# Patient Record
Sex: Female | Born: 1965
Health system: Southern US, Community
[De-identification: ages and names within clinical notes are randomized; demographics above are authoritative.]

## PROBLEM LIST (undated history)

## (undated) DIAGNOSIS — F32A Depression, unspecified: Secondary | ICD-10-CM

## (undated) DIAGNOSIS — R011 Cardiac murmur, unspecified: Secondary | ICD-10-CM

## (undated) DIAGNOSIS — F1910 Other psychoactive substance abuse, uncomplicated: Secondary | ICD-10-CM

## (undated) DIAGNOSIS — I890 Lymphedema, not elsewhere classified: Secondary | ICD-10-CM

## (undated) DIAGNOSIS — E063 Autoimmune thyroiditis: Secondary | ICD-10-CM

## (undated) DIAGNOSIS — F419 Anxiety disorder, unspecified: Secondary | ICD-10-CM

## (undated) DIAGNOSIS — E038 Other specified hypothyroidism: Secondary | ICD-10-CM

## (undated) DIAGNOSIS — R609 Edema, unspecified: Secondary | ICD-10-CM

## (undated) DIAGNOSIS — F329 Major depressive disorder, single episode, unspecified: Secondary | ICD-10-CM

## (undated) HISTORY — PX: TONSILLECTOMY: SUR1361

## (undated) HISTORY — DX: Autoimmune thyroiditis: E06.3

## (undated) HISTORY — DX: Other psychoactive substance abuse, uncomplicated: F19.10

## (undated) HISTORY — DX: Anxiety disorder, unspecified: F41.9

## (undated) HISTORY — DX: Depression, unspecified: F32.A

## (undated) HISTORY — PX: OTHER SURGICAL HISTORY: SHX169

## (undated) HISTORY — DX: Edema, unspecified: R60.9

## (undated) HISTORY — DX: Major depressive disorder, single episode, unspecified: F32.9

## (undated) HISTORY — DX: Lymphedema, not elsewhere classified: I89.0

## (undated) HISTORY — PX: MEDIAL PARTIAL KNEE REPLACEMENT: SHX5965

## (undated) HISTORY — DX: Other specified hypothyroidism: E03.8

## (undated) HISTORY — PX: TUBAL LIGATION: SHX77

## (undated) HISTORY — DX: Cardiac murmur, unspecified: R01.1

---

## 1997-06-27 ENCOUNTER — Encounter: Admission: RE | Admit: 1997-06-27 | Discharge: 1997-09-25 | Payer: Self-pay | Admitting: *Deleted

## 1997-07-01 ENCOUNTER — Emergency Department (HOSPITAL_COMMUNITY): Admission: RE | Admit: 1997-07-01 | Discharge: 1997-07-01 | Payer: Self-pay | Admitting: Emergency Medicine

## 1998-05-07 ENCOUNTER — Emergency Department (HOSPITAL_COMMUNITY): Admission: EM | Admit: 1998-05-07 | Discharge: 1998-05-07 | Payer: Self-pay | Admitting: Emergency Medicine

## 1998-05-13 ENCOUNTER — Emergency Department (HOSPITAL_COMMUNITY): Admission: EM | Admit: 1998-05-13 | Discharge: 1998-05-13 | Payer: Self-pay | Admitting: Emergency Medicine

## 1998-09-03 ENCOUNTER — Emergency Department (HOSPITAL_COMMUNITY): Admission: EM | Admit: 1998-09-03 | Discharge: 1998-09-03 | Payer: Self-pay | Admitting: Internal Medicine

## 1998-09-15 ENCOUNTER — Emergency Department (HOSPITAL_COMMUNITY): Admission: EM | Admit: 1998-09-15 | Discharge: 1998-09-15 | Payer: Self-pay | Admitting: Emergency Medicine

## 1998-09-15 ENCOUNTER — Encounter: Payer: Self-pay | Admitting: Emergency Medicine

## 1998-12-09 ENCOUNTER — Inpatient Hospital Stay (HOSPITAL_COMMUNITY): Admission: EM | Admit: 1998-12-09 | Discharge: 1998-12-12 | Payer: Self-pay | Admitting: Emergency Medicine

## 1998-12-12 ENCOUNTER — Other Ambulatory Visit (HOSPITAL_COMMUNITY): Admission: RE | Admit: 1998-12-12 | Discharge: 1999-01-18 | Payer: Self-pay | Admitting: Psychiatry

## 1999-03-17 ENCOUNTER — Emergency Department (HOSPITAL_COMMUNITY): Admission: EM | Admit: 1999-03-17 | Discharge: 1999-03-17 | Payer: Self-pay | Admitting: Emergency Medicine

## 1999-03-31 ENCOUNTER — Emergency Department (HOSPITAL_COMMUNITY): Admission: EM | Admit: 1999-03-31 | Discharge: 1999-03-31 | Payer: Self-pay | Admitting: *Deleted

## 1999-06-16 ENCOUNTER — Emergency Department (HOSPITAL_COMMUNITY): Admission: EM | Admit: 1999-06-16 | Discharge: 1999-06-16 | Payer: Self-pay

## 1999-07-01 ENCOUNTER — Inpatient Hospital Stay (HOSPITAL_COMMUNITY): Admission: AD | Admit: 1999-07-01 | Discharge: 1999-07-01 | Payer: Self-pay | Admitting: Obstetrics and Gynecology

## 1999-07-01 ENCOUNTER — Encounter: Payer: Self-pay | Admitting: Obstetrics and Gynecology

## 1999-07-16 ENCOUNTER — Other Ambulatory Visit: Admission: RE | Admit: 1999-07-16 | Discharge: 1999-07-16 | Payer: Self-pay | Admitting: Obstetrics and Gynecology

## 1999-08-04 ENCOUNTER — Inpatient Hospital Stay (HOSPITAL_COMMUNITY): Admission: AD | Admit: 1999-08-04 | Discharge: 1999-08-04 | Payer: Self-pay | Admitting: Obstetrics and Gynecology

## 1999-08-16 ENCOUNTER — Inpatient Hospital Stay (HOSPITAL_COMMUNITY): Admission: AD | Admit: 1999-08-16 | Discharge: 1999-08-16 | Payer: Self-pay | Admitting: *Deleted

## 1999-08-16 ENCOUNTER — Encounter: Payer: Self-pay | Admitting: *Deleted

## 1999-08-18 ENCOUNTER — Emergency Department (HOSPITAL_COMMUNITY): Admission: EM | Admit: 1999-08-18 | Discharge: 1999-08-18 | Payer: Self-pay | Admitting: Emergency Medicine

## 1999-08-25 ENCOUNTER — Emergency Department (HOSPITAL_COMMUNITY): Admission: EM | Admit: 1999-08-25 | Discharge: 1999-08-25 | Payer: Self-pay | Admitting: Emergency Medicine

## 1999-08-31 ENCOUNTER — Emergency Department (HOSPITAL_COMMUNITY): Admission: EM | Admit: 1999-08-31 | Discharge: 1999-08-31 | Payer: Self-pay | Admitting: Emergency Medicine

## 1999-08-31 ENCOUNTER — Encounter: Payer: Self-pay | Admitting: Emergency Medicine

## 1999-09-06 ENCOUNTER — Emergency Department (HOSPITAL_COMMUNITY): Admission: EM | Admit: 1999-09-06 | Discharge: 1999-09-06 | Payer: Self-pay | Admitting: Emergency Medicine

## 1999-09-09 ENCOUNTER — Emergency Department (HOSPITAL_COMMUNITY): Admission: EM | Admit: 1999-09-09 | Discharge: 1999-09-09 | Payer: Self-pay | Admitting: Emergency Medicine

## 1999-09-13 ENCOUNTER — Emergency Department (HOSPITAL_COMMUNITY): Admission: EM | Admit: 1999-09-13 | Discharge: 1999-09-13 | Payer: Self-pay | Admitting: Emergency Medicine

## 1999-09-16 ENCOUNTER — Emergency Department (HOSPITAL_COMMUNITY): Admission: EM | Admit: 1999-09-16 | Discharge: 1999-09-16 | Payer: Self-pay | Admitting: Emergency Medicine

## 1999-09-21 ENCOUNTER — Emergency Department (HOSPITAL_COMMUNITY): Admission: EM | Admit: 1999-09-21 | Discharge: 1999-09-21 | Payer: Self-pay | Admitting: Internal Medicine

## 1999-10-29 ENCOUNTER — Emergency Department (HOSPITAL_COMMUNITY): Admission: EM | Admit: 1999-10-29 | Discharge: 1999-10-29 | Payer: Self-pay | Admitting: Emergency Medicine

## 1999-12-22 ENCOUNTER — Inpatient Hospital Stay (HOSPITAL_COMMUNITY): Admission: AD | Admit: 1999-12-22 | Discharge: 1999-12-22 | Payer: Self-pay | Admitting: Obstetrics and Gynecology

## 2000-02-22 ENCOUNTER — Inpatient Hospital Stay (HOSPITAL_COMMUNITY): Admission: AD | Admit: 2000-02-22 | Discharge: 2000-02-26 | Payer: Self-pay | Admitting: Obstetrics and Gynecology

## 2000-02-28 ENCOUNTER — Inpatient Hospital Stay (HOSPITAL_COMMUNITY): Admission: AD | Admit: 2000-02-28 | Discharge: 2000-02-28 | Payer: Self-pay | Admitting: Obstetrics and Gynecology

## 2000-03-17 ENCOUNTER — Emergency Department (HOSPITAL_COMMUNITY): Admission: EM | Admit: 2000-03-17 | Discharge: 2000-03-17 | Payer: Self-pay | Admitting: Emergency Medicine

## 2000-03-31 ENCOUNTER — Emergency Department (HOSPITAL_COMMUNITY): Admission: EM | Admit: 2000-03-31 | Discharge: 2000-03-31 | Payer: Self-pay | Admitting: Internal Medicine

## 2000-03-31 ENCOUNTER — Encounter: Payer: Self-pay | Admitting: Internal Medicine

## 2000-04-09 ENCOUNTER — Other Ambulatory Visit: Admission: RE | Admit: 2000-04-09 | Discharge: 2000-04-09 | Payer: Self-pay | Admitting: Obstetrics and Gynecology

## 2000-05-28 ENCOUNTER — Emergency Department (HOSPITAL_COMMUNITY): Admission: EM | Admit: 2000-05-28 | Discharge: 2000-05-28 | Payer: Self-pay | Admitting: Emergency Medicine

## 2000-06-05 ENCOUNTER — Emergency Department (HOSPITAL_COMMUNITY): Admission: EM | Admit: 2000-06-05 | Discharge: 2000-06-05 | Payer: Self-pay | Admitting: Emergency Medicine

## 2000-06-18 ENCOUNTER — Emergency Department (HOSPITAL_COMMUNITY): Admission: EM | Admit: 2000-06-18 | Discharge: 2000-06-18 | Payer: Self-pay | Admitting: Emergency Medicine

## 2000-07-26 ENCOUNTER — Emergency Department (HOSPITAL_COMMUNITY): Admission: EM | Admit: 2000-07-26 | Discharge: 2000-07-26 | Payer: Self-pay | Admitting: Emergency Medicine

## 2000-08-02 ENCOUNTER — Emergency Department (HOSPITAL_COMMUNITY): Admission: EM | Admit: 2000-08-02 | Discharge: 2000-08-02 | Payer: Self-pay | Admitting: Emergency Medicine

## 2000-09-10 ENCOUNTER — Emergency Department (HOSPITAL_COMMUNITY): Admission: EM | Admit: 2000-09-10 | Discharge: 2000-09-10 | Payer: Self-pay | Admitting: Internal Medicine

## 2000-09-17 ENCOUNTER — Emergency Department (HOSPITAL_COMMUNITY): Admission: EM | Admit: 2000-09-17 | Discharge: 2000-09-17 | Payer: Self-pay | Admitting: Emergency Medicine

## 2000-09-27 ENCOUNTER — Inpatient Hospital Stay (HOSPITAL_COMMUNITY): Admission: AD | Admit: 2000-09-27 | Discharge: 2000-09-27 | Payer: Self-pay | Admitting: *Deleted

## 2000-10-02 ENCOUNTER — Emergency Department (HOSPITAL_COMMUNITY): Admission: EM | Admit: 2000-10-02 | Discharge: 2000-10-02 | Payer: Self-pay | Admitting: Internal Medicine

## 2000-10-22 ENCOUNTER — Inpatient Hospital Stay (HOSPITAL_COMMUNITY): Admission: EM | Admit: 2000-10-22 | Discharge: 2000-10-27 | Payer: Self-pay | Admitting: *Deleted

## 2000-10-28 ENCOUNTER — Other Ambulatory Visit (HOSPITAL_COMMUNITY): Admission: RE | Admit: 2000-10-28 | Discharge: 2000-11-07 | Payer: Self-pay | Admitting: Psychiatry

## 2001-02-05 ENCOUNTER — Emergency Department (HOSPITAL_COMMUNITY): Admission: EM | Admit: 2001-02-05 | Discharge: 2001-02-05 | Payer: Self-pay | Admitting: Emergency Medicine

## 2001-02-17 ENCOUNTER — Emergency Department (HOSPITAL_COMMUNITY): Admission: EM | Admit: 2001-02-17 | Discharge: 2001-02-17 | Payer: Self-pay | Admitting: Emergency Medicine

## 2001-03-05 ENCOUNTER — Emergency Department (HOSPITAL_COMMUNITY): Admission: EM | Admit: 2001-03-05 | Discharge: 2001-03-05 | Payer: Self-pay

## 2001-03-25 ENCOUNTER — Emergency Department (HOSPITAL_COMMUNITY): Admission: EM | Admit: 2001-03-25 | Discharge: 2001-03-25 | Payer: Self-pay | Admitting: Emergency Medicine

## 2001-06-03 ENCOUNTER — Emergency Department (HOSPITAL_COMMUNITY): Admission: EM | Admit: 2001-06-03 | Discharge: 2001-06-03 | Payer: Self-pay | Admitting: Emergency Medicine

## 2001-07-19 ENCOUNTER — Emergency Department (HOSPITAL_COMMUNITY): Admission: EM | Admit: 2001-07-19 | Discharge: 2001-07-19 | Payer: Self-pay | Admitting: *Deleted

## 2001-12-07 ENCOUNTER — Other Ambulatory Visit: Admission: RE | Admit: 2001-12-07 | Discharge: 2001-12-07 | Payer: Self-pay | Admitting: *Deleted

## 2002-03-08 ENCOUNTER — Other Ambulatory Visit (HOSPITAL_COMMUNITY): Admission: RE | Admit: 2002-03-08 | Discharge: 2002-03-26 | Payer: Self-pay | Admitting: Psychiatry

## 2002-12-24 ENCOUNTER — Other Ambulatory Visit: Admission: RE | Admit: 2002-12-24 | Discharge: 2002-12-24 | Payer: Self-pay | Admitting: *Deleted

## 2003-03-13 ENCOUNTER — Inpatient Hospital Stay (HOSPITAL_COMMUNITY): Admission: EM | Admit: 2003-03-13 | Discharge: 2003-03-15 | Payer: Self-pay | Admitting: Emergency Medicine

## 2003-03-13 ENCOUNTER — Encounter: Payer: Self-pay | Admitting: Emergency Medicine

## 2003-03-23 ENCOUNTER — Encounter: Admission: RE | Admit: 2003-03-23 | Discharge: 2003-03-23 | Payer: Self-pay | Admitting: Family Medicine

## 2004-06-06 ENCOUNTER — Ambulatory Visit (HOSPITAL_BASED_OUTPATIENT_CLINIC_OR_DEPARTMENT_OTHER): Admission: RE | Admit: 2004-06-06 | Discharge: 2004-06-06 | Payer: Self-pay | Admitting: Obstetrics and Gynecology

## 2004-06-06 ENCOUNTER — Encounter (INDEPENDENT_AMBULATORY_CARE_PROVIDER_SITE_OTHER): Payer: Self-pay | Admitting: Specialist

## 2004-06-06 ENCOUNTER — Ambulatory Visit (HOSPITAL_COMMUNITY): Admission: RE | Admit: 2004-06-06 | Discharge: 2004-06-06 | Payer: Self-pay | Admitting: Obstetrics and Gynecology

## 2004-11-23 ENCOUNTER — Ambulatory Visit (HOSPITAL_COMMUNITY): Admission: RE | Admit: 2004-11-23 | Discharge: 2004-11-23 | Payer: Self-pay | Admitting: Obstetrics and Gynecology

## 2006-02-14 ENCOUNTER — Ambulatory Visit (HOSPITAL_COMMUNITY): Admission: RE | Admit: 2006-02-14 | Discharge: 2006-02-14 | Payer: Self-pay | Admitting: Obstetrics and Gynecology

## 2006-02-24 ENCOUNTER — Encounter: Admission: RE | Admit: 2006-02-24 | Discharge: 2006-02-24 | Payer: Self-pay | Admitting: Obstetrics and Gynecology

## 2007-02-14 ENCOUNTER — Emergency Department (HOSPITAL_COMMUNITY): Admission: RE | Admit: 2007-02-14 | Discharge: 2007-02-14 | Payer: Self-pay | Admitting: Family Medicine

## 2007-02-23 ENCOUNTER — Ambulatory Visit (HOSPITAL_COMMUNITY): Admission: RE | Admit: 2007-02-23 | Discharge: 2007-02-25 | Payer: Self-pay | Admitting: Neurological Surgery

## 2007-11-04 ENCOUNTER — Ambulatory Visit: Payer: Self-pay | Admitting: Licensed Clinical Social Worker

## 2007-11-12 ENCOUNTER — Ambulatory Visit: Payer: Self-pay | Admitting: Licensed Clinical Social Worker

## 2007-11-24 ENCOUNTER — Ambulatory Visit: Payer: Self-pay | Admitting: Licensed Clinical Social Worker

## 2007-12-02 ENCOUNTER — Ambulatory Visit: Payer: Self-pay | Admitting: Licensed Clinical Social Worker

## 2007-12-09 ENCOUNTER — Ambulatory Visit: Payer: Self-pay | Admitting: Licensed Clinical Social Worker

## 2007-12-23 ENCOUNTER — Ambulatory Visit: Payer: Self-pay | Admitting: Licensed Clinical Social Worker

## 2007-12-29 ENCOUNTER — Ambulatory Visit: Payer: Self-pay | Admitting: Licensed Clinical Social Worker

## 2008-01-13 ENCOUNTER — Ambulatory Visit: Payer: Self-pay | Admitting: Licensed Clinical Social Worker

## 2008-01-21 ENCOUNTER — Ambulatory Visit: Payer: Self-pay | Admitting: Licensed Clinical Social Worker

## 2008-03-17 ENCOUNTER — Ambulatory Visit: Payer: Self-pay | Admitting: Licensed Clinical Social Worker

## 2008-04-04 ENCOUNTER — Ambulatory Visit: Payer: Self-pay | Admitting: Family Medicine

## 2008-04-04 DIAGNOSIS — F329 Major depressive disorder, single episode, unspecified: Secondary | ICD-10-CM | POA: Insufficient documentation

## 2008-04-04 LAB — CONVERTED CEMR LAB
ALT: 19 units/L (ref 0–35)
AST: 20 units/L (ref 0–37)
Chloride: 106 meq/L (ref 96–112)
Cholesterol: 171 mg/dL (ref 0–200)
Eosinophils Absolute: 0 10*3/uL (ref 0.0–0.7)
GFR calc Af Amer: 117 mL/min
HCT: 37.5 % (ref 36.0–46.0)
HDL: 82.6 mg/dL (ref >39.0)
LDL Cholesterol: 73 mg/dL (ref 0–99)
Lymphocytes Relative: 19 % (ref 12.0–46.0)
MCV: 88.1 fL (ref 78.0–100.0)
Monocytes Absolute: 0.2 10*3/uL (ref 0.1–1.0)
Neutro Abs: 4 10*3/uL (ref 1.4–7.7)
Platelets: 370 10*3/uL (ref 150–400)
RDW: 13.2 % (ref 11.5–14.6)
Total CHOL/HDL Ratio: 2.1
VLDL: 15 mg/dL (ref 0–40)

## 2008-04-05 ENCOUNTER — Telehealth: Payer: Self-pay | Admitting: *Deleted

## 2008-04-07 DIAGNOSIS — J1089 Influenza due to other identified influenza virus with other manifestations: Secondary | ICD-10-CM | POA: Insufficient documentation

## 2008-04-08 ENCOUNTER — Telehealth: Payer: Self-pay | Admitting: Internal Medicine

## 2008-04-12 ENCOUNTER — Ambulatory Visit: Payer: Self-pay | Admitting: Family Medicine

## 2008-04-13 ENCOUNTER — Telehealth: Payer: Self-pay | Admitting: *Deleted

## 2008-04-16 ENCOUNTER — Ambulatory Visit: Payer: Self-pay | Admitting: Family Medicine

## 2008-04-19 ENCOUNTER — Telehealth: Payer: Self-pay | Admitting: Family Medicine

## 2008-04-22 ENCOUNTER — Telehealth: Payer: Self-pay | Admitting: Family Medicine

## 2008-04-28 ENCOUNTER — Telehealth: Payer: Self-pay | Admitting: Family Medicine

## 2008-05-03 ENCOUNTER — Encounter: Payer: Self-pay | Admitting: Family Medicine

## 2008-06-21 ENCOUNTER — Encounter: Payer: Self-pay | Admitting: Family Medicine

## 2009-03-07 DIAGNOSIS — F1910 Other psychoactive substance abuse, uncomplicated: Secondary | ICD-10-CM

## 2009-03-07 HISTORY — DX: Other psychoactive substance abuse, uncomplicated: F19.10

## 2009-03-15 ENCOUNTER — Encounter: Admission: RE | Admit: 2009-03-15 | Discharge: 2009-03-15 | Payer: Self-pay | Admitting: Obstetrics and Gynecology

## 2010-06-16 ENCOUNTER — Encounter: Payer: Self-pay | Admitting: Obstetrics and Gynecology

## 2010-06-17 ENCOUNTER — Encounter: Payer: Self-pay | Admitting: Obstetrics and Gynecology

## 2010-06-18 ENCOUNTER — Encounter: Payer: Self-pay | Admitting: Obstetrics and Gynecology

## 2010-10-09 NOTE — H&P (Signed)
Megan Cook, [name] NO.:  [id number]   MEDICAL RECORD NO.:  [id number]          PATIENT TYPE:  INP   LOCATION:  3025                         FACILITY:  [facility]   PHYSICIAN:  Megan Cook, M.D.  DATE OF BIRTH:  [birth year]   DATE OF ADMISSION:  02/23/2007  DATE OF DISCHARGE:                              HISTORY & PHYSICAL   ADMITTING DIAGNOSIS:  Cervical herniated nucleus pulposus, with  spondylosis at C5-6 and C6-7, right cervical radiculopathy.   HISTORY OF PRESENT ILLNESS:  Megan Cook is a 45 year old right-handed  individual who was seen at an urgent care by an orthopedist a couple of  weeks ago.  She complained of severe right shoulder and arm pain, and an  MRI demonstrated presence of a foraminal disc herniation at C6-7 in  addition to spondylitic degeneration and disc herniation of the  subligamentous space at C5-6.  She was initially advised regarding  conservative management.  However, over the past week and half period of  time, she has failed at this and is miserable with pain, now not  receiving relief despite the use of hydrocodone, Valium, and most  recently again being replaced on Decadron.  She initially was hopeful  that she could be treated with decompression and disc arthroplasty.  However, at this time she notes that she cannot bear the pain, while we  have been trying to seek approval from her insurance carrier, [facility], which has flatly denied that option for the patient.  She  is now being admitted to go to the operating room to undergo surgical  decompression and arthrodesis at C5-6 and C6-7.   PAST MEDICAL HISTORY:  1. Her general health has been good.  2. She has hypothyroidism and has been started on Synthroid 0.1 mg      this past summer.   SURGERIES:  1. Tubal ligation in January 2007.  2. Two C-sections in 2001 and 1998.   She notes allergies to CARBOCAINE, which causes swelling at the site of  the  injection.   MEDICATIONS CURRENTLY:  1. The Decadron that she had been using as an outpatient just      recently.  2. Hydrocodone 5-10 mg q.6 h. for pain.   SOCIAL HISTORY:  Reveals that she does not smoke.  She does not drink  alcohol.  She has been in recovery for difficulties with the use of  hydrocodone.  She has had some recent weight gain this past winter  secondary to hypothyroidism.  She has noted a weight gain of 40 pounds.  She is currently 5 feet 7 inches, 195 pounds.   SYSTEMS REVIEW:  Notable for arm weakness, arm pain, neck pain, thyroid  disease and a 14-point review sheet done in the office today.   PHYSICAL EXAMINATION:  NEUROLOGIC:  Reveals that she is an alert,  oriented individual, in moderate distress with neck pain, shoulder and  right arm pain.  She tends to hold her right arm in a flexed fixed  position during the examination, and this is being supported even at the  outer  elbow.  Range of motion in her neck reveals that she can turn 45  degrees left and right.  She finds that she gets some relief of her  symptoms with flexion of the neck forward.  There is no tenderness  overtly in the supraclavicular fossa.  No masses are noted.  Confrontational testing reveals that the deltoids have good strength.  Biceps are 4/5 on the right side.  Wrist extensors are 4/5.  Finger  extensors are 4/5.  Triceps strength is 4/5.  It is definitively weak  compared to the left side.  Deep tendon reflexes are 2+ in the biceps,  absent in the right triceps, 2+ in left triceps, 1+ in the brachial  radialis.  Sensation is diminished on the ulnar aspect of the right hand  from the index finger on out compared to the left-hand.  HEENT:  Otherwise normal.  NECK:  Supple, save for the patient's current positions.  LUNGS:  Clear to auscultation.  HEART:  Regular rate and rhythm.  No murmurs are heard.  ABDOMEN:  Soft, protuberant.  Bowel sounds are positive.  No masses are   palpable.  EXTREMITIES:  Reveal no cyanosis, clubbing, or edema.   IMPRESSION:  The patient has evidence of a herniated nucleus pulposus at  C5-6 and C6-7, with spondylosis primarily at C5-6. She has been advised  regarding the need for surgical decompression and stabilization from C5-  C7.  This is now going to be carried out.      Megan Cook, M.D.  Electronically Signed     HJE/MEDQ  D:  02/24/2007  T:  02/25/2007  Job:  [id number]

## 2010-10-09 NOTE — Op Note (Signed)
NAMEJALEYA, PEBLEY NO.:  [id number]   MEDICAL RECORD NO.:  [id number]          PATIENT TYPE:  INP   LOCATION:  3025                         FACILITY:  [facility]   PHYSICIAN:  Stefani Dama, M.D.  DATE OF BIRTH:  February 28, 1966   DATE OF PROCEDURE:  02/24/2007  DATE OF DISCHARGE:  02/23/2007                               OPERATIVE REPORT   PREOPERATIVE DIAGNOSIS:  Herniated nucleus pulposus and spondylosis C5-  C6 and C6-C7 with right cervical radiculopathy.   POSTOPERATIVE DIAGNOSIS:  Herniated nucleus pulposus and spondylosis C5-  C6 and C6-C7 with right cervical radiculopathy.   PROCEDURE:  Anterior cervical decompression C5-C6 and C6-C7, arthrodesis  with structural allograft, anterior plate fixation C5 to C7.   SURGEON:  Stefani Dama, M.D.   ANESTHESIA:  General endotracheal.   INDICATIONS:  Megan Cook is a 45 year old individual who has had  significant neck, shoulder, and right arm pain.  She has evidence of  spondylitic disease in addition to a herniated nucleus pulposus at C5-C6  and at C6-C7.  It is out the foramen and is giving her severe right  cervical radiculopathy.  She has been advised regarding surgical  decompression of this process.   OPERATIVE PROCEDURE:  The patient was brought to the operating room and  placed on the table in a supine position. After the smooth induction of  general endotracheal anesthesia, she was placed in 5 pounds of halter  traction.  Her neck was shaved, prepped with DuraPrep, and draped in a  sterile fashion.  A transverse incision was created in the left side of  the neck and this was carried down through the platysma.  A plane  between the sternocleidomastoid and the strap muscles was dissected  bluntly until the prevertebral space was reached.  The first  identifiable disc space was noted to be that of C5-C6.  The longus coli  muscle was stripped off of each side of the vertebra and a self-  retaining  retractor was placed in the wound.  The anterior disc space  was then opened with a #15 blade and a combination of Kerrison rongeurs  were used to evacuate a substantial quantity of moderately degenerated  disc material.  As the region of the posterior longitudinal ligament was  reached, there was noted to be some substantial osteophytosis.  The  ligament was taken up then with the 2 mm gold Kerrison punch and  dissection was carried out laterally. There was some small  subligamentous fragments of disc in this region in the lateral recess.  These were removed.  Decompression further allowed good visualization of  the prevertebral space across the left side.  Small osteophytes were  taken off of this side. No disc herniation was noted.  The endplates  were then smoothed and all the cartilaginous material was removed from  the vertebral endplates at C5-C6.  Attention was then turned to C6-C7  where a similar process was undertaken. Here, there was a much larger  free fragment of disc material that had ruptured through and was lying  just ventral to the  nerve root, itself.  This was removed and allowed  for good egress and decompression of the nerve root, itself.  Hemostasis  was achieved in a similar fashion and the endplates were smoothed with a  4 mm barrel bit. Then, two bone grafts, these were allograft transgrafts  measuring 7 mm in height, were prepared by contouring the posterior  aspects of the graft and removing some of the ridges that were cut into  the bone graft themselves.  When the grafts were finally shaped and  sized correctly, they were filled with demineralized bone matrix and  placed into the interspace, first at C6-C7 then at C5-C6.  They were  countersunk slightly. With this, traction was removed.  The soft tissues  were checked for hemostasis and a 34 mm standard size trellis plate was  fitted to the ventral aspect of the vertebral bodies and secured with  six locking  screws measuring 14 mm in size.  These were variable angle  screws. Final localization of the surgical construct was obtained with a  singular radiograph.  The prevertebral tissues were then checked for  hemostasis.  The wound was irrigated copiously with antibiotic  irrigating solution and the platysma was closed with 3-0 Vicryl in an  interrupted fashion.  Then, the  subcuticular tissue was closed with 3-0  Vicryl in an inverted interrupted fashion.  A dry sterile dressing was  placed on the patient's neck.  The patient tolerated procedure well and  was returned to the recovery room in stable condition.      Stefani Dama, M.D.  Electronically Signed     HJE/MEDQ  D:  02/24/2007  T:  02/24/2007  Job:  [id number]

## 2010-10-12 NOTE — Op Note (Signed)
[facility]  Patient:    Megan Cook, Megan Cook                      MRN: [id number] Proc. Date: 02/22/00 Attending:  Lenoard Aden CC:         [facility]   Operative Report  PREOPERATIVE DIAGNOSES:       1. Thirty-nine week intrauterine pregnancy.                               2. Polyhydramnios.                               3. Presumed macrosomia.                               4. Previous cesarean section, for elective                                  repeat.  POSTOPERATIVE DIAGNOSES:      1. Thirty-nine week intrauterine pregnancy.                               2. Polyhydramnios.                               3. Presumed macrosomia.                               4. Previous cesarean section, for elective                                  repeat.  OPERATION:                    Repeat low segment transverse cesarean section.  SURGEON:                      Lenoard Aden, M.D.  ASSISTANT:                    Cordelia Pen A. Rosalio Macadamia, M.D.  ANESTHESIA:                   Spinal by J. Leilani Able, Montez Hageman., M.D.  ESTIMATED BLOOD LOSS:         1000 cc.  COMPLICATIONS:                None.  FINDINGS:                     A full-term living female, 9 pounds and 6 ounces.  Apgars 8 and 9.  Occiput anterior.  Delivered with vacuum assistance x 1 pull.  Placenta manually intact and three vessel cord noted. Extensive bladder to anterior uterine wall adhesions which were lysed sharply. Normal tubes and ovaries.  The patient to recovery in good condition.  All counts correct.  DESCRIPTION OF PROCEDURE:     After being apprised of the risks of anesthesia, infection, bleeding, injury to abdominal organs, and need  for repair, the patient was administered a spinal anesthetic without complications, prepped and draped in the usual sterile fashion.  A Foley catheter was placed and a Pfannenstiel skin incision was made after achieving adequate anesthesia,  and carried down to the fascia which was nicked in midline and opened transversely using Mayo scissors.  The rectus muscle was dissected sharply in the midline. The peritoneum was entered sharply.  Upon entering into the peritoneal cavity, it was noted that the uterus and the lower uterine segment was adhesed to the portion of the fascia.  This was lysed sharply using Mayo scissors and sharp dissection.  The bladder was then developed and dissected sharply off the lower uterine segment and bladder blade placed.  The uterus was scored in a smile-like fashion and vacuum assistance used for delivery of a 9 pound and 6 ounce female from occiput anterior position.  Apgars 8 and 9.  Placenta manually intact and three vessel cord noted.  The uterus was exteriorized with normal tubes and ovaries noted.  The incision was closed using a 0 Monocryl in a continuous running fashion.  Good hemostasis noted.  The uterus was placed in the abdominal cavity and pericolic gutters irrigated and all blood and clot subsequently removed.  Reinspection reveals normally good hemostasis of the incision and normal bladder flap.                                At this time, the fascia was closed using a 0 Vicryl in a continuous running fashion and the skin closed using staples. Dilute Marcaine solution placed.  The patient tolerated the procedure well and went to recovery in good condition. DD:  02/22/00 TD:  02/23/00 Job: [id number]

## 2010-10-12 NOTE — Discharge Summary (Signed)
[facility]  Patient:    Megan Cook, Megan Cook                      MRN: [id number] Adm. Date:  [id number] Disc. Date: [id number] Attending:  Shelba Flake                           Discharge Summary  HOSPITAL COURSE:              The patient underwent an uncomplicated repeat C section on February 22, 2000.  Postpartum course uncomplicated, tolerated a regular diet well, discharged to home on day #3.  DISCHARGE MEDICATIONS:        Prenatal vitamins, iron, and Tylox given. Motrin recommended.  FOLLOWUP:                     In the office in four to six weeks.  DISCHARGE INSTRUCTIONS:       Discharge teaching done. DD:  03/16/00 TD:  03/17/00 Job: [id number]

## 2010-10-12 NOTE — H&P (Signed)
[facility]  Patient:    Megan Cook, Megan Cook                      MRN: [id number] Adm. Date:  [id number] Attending:  Lenoard Aden Dictator:   Lenoard Aden, M.D.                         History and Physical  CHIEF COMPLAINT:              Elective repeat C-section.  HISTORY OF PRESENT ILLNESS:   The patient is a 45 year old white female, G27, P1-0-2-1, EDD February 26, 2000, for elective repeat C-section at 39 weeks. History of polyhydramnios and questionable macrosomia.  PAST MEDICAL HISTORY:         Remarkable for one uncomplicated C-section in 1998, previous history of narcotic abuse many years in the past, history of knee surgery in 1987 and 1988, tonsillectomy in 1991, laser ablation of cervix in 1990.  Pregnancy complicated by prolonged upper respiratory infection requiring multiple doses of antibiotics and preterm cervical change and polyhydramnios.  PRENATAL LABORATORY DATA:     Blood type of O positive, Rh antibody negative, VDRL nonreactive.  Rubella immune.  Hepatitis B surface antigen negative.  HIV nonreactive.  PHYSICAL EXAMINATION:  GENERAL:                      She is a well-developed, well-nourished white female in no apparent distress.  HEENT:                        Normal.  LUNGS:                        Clear.  HEART:                        Regular rate and rhythm.  ABDOMEN:                      Soft, gravid, nontender.  Estimated fetal weight 8 pounds.  PELVIC:                       The cervix is 2 cm, thick, vertex and -2 to -3.  EXTREMITIES:                  Reveals no cords.  NEUROLOGIC EXAM:              Nonfocal.  IMPRESSION:                   A 39-week intrauterine pregnancy for elective                               repeat cesarean section.  PLAN:                         Proceed with elective repeat LTCS.  The risks of anesthesia, infection, bleeding, injury to abdominal organs and the need for repair was  discussed.  The patient acknowledges and desires to proceed. DD:  02/22/00 TD:  02/22/00 Job: [id number]

## 2010-10-12 NOTE — Discharge Summary (Signed)
[facility]  Patient:    Megan Cook, Megan Cook                      MRN: [id number] Adm. Date:  [id number] Disc. Date: [id number] Attending:  Carolanne Grumbling D Dictator:   Candi Leash. Orsini, N.P.                           Discharge Summary  There was no dictation on this report. DD:  12/08/00 TD:  12/08/00 Job: [id number]

## 2010-10-12 NOTE — H&P (Signed)
[facility]  Patient:    Megan Cook, Megan Cook                      MRN: [id number] Adm. Date:  [id number] Attending:  Denny Peon Dictator:   Young Berry Scott, N.P.                         History and Physical  DATE OF EXAMINATION:  Oct 24, 2000 at 3:30 p.m.  GENERAL:  This is a well-nourished, well-developed female who is generally healthy in appearance.  Vital signs already noted.  She is relaxed and cooperative with exam.  Only complaint is recent recovery from an upper respiratory infection for which she took a round of Avelox, which resolved her symptoms.  Patient has no somatic complaints at this time.  Has a family history of hypothyroidism.  SKIN:  Warm, smooth and dry with scattered macules all over, generally fair in tone.  HAIR:  Hair is light brown and tinted blond.  Hair distribution is within normal limits for age and sex.  HEAD:  Normocephalic and held midline.  EYES:  PERRLA.  Vessels on fundi show no nicking bilaterally.  Disks not visualized.  EARS:  External ear canals are patent and TMs show normal light reflex bilaterally.  Landmarks visible.  NOSE:  No rhinorrhea.  Mucosa is not edematous.  Pink and moist.  MOUTH:  Shows dental work.  Teeth in satisfactory condition.  No breath odor. Tongue is midline without fasciculation.  Soft palate rise symmetrically.  NECK:  No thyromegaly.  It is supple.  CARDIOVASCULAR:  S1 and S2 heard.  No clicks, murmurs or gallops.  Peripheral pulses are 2+/5.  Patient does have 1+ edema in both lower legs, which she attributes to her substance abuse and using.  Patient states edema increases with frequency of opiate use.  LUNGS:  Clear to auscultation.  CHEST:  Symmetrical.  EXTREMITIES:  Pink and warm.  Good capillary refill.  No CVA tenderness noted.  ABDOMEN:  Bowel sounds present in all four quadrants.  Liver percusses 1.5 cm below the costal margin.  The patient has evidence  of some striae on the abdomen.  No masses or tenderness.  GENITALIA:  Deferred.  MUSCULOSKELETAL:  The patient is of upright posture.  Has straight spine. Gait is normal.  Strength is 5/5 throughout.  Coordination is satisfactory.  NEUROLOGIC:  Cranial nerves II-XII are intact.  Extraocular movements are intact without nystagmus.  Motor movements are smooth without tremor.  Sensory is intact.  Deep tendon reflexes, biceps, patellar and Achilles reflexes are 2+ out of 5.  Cerebellar function is intact for heel-to-shin maneuvers and rapid alternating movements.  Romberg is without findings. DD:  10/24/00 TD:  10/24/00 Job: [id number]

## 2010-10-12 NOTE — Discharge Summary (Signed)
NAME:  Megan Cook, Megan Cook                         ACCOUNT NO.:  [id number]   MEDICAL RECORD NO.:  [id number]                   PATIENT TYPE:  INP   LOCATION:  5702                                 FACILITY:  [facility]   PHYSICIAN:  [name], MD                        DATE OF BIRTH:  02-26-1966   DATE OF ADMISSION:  03/13/2003  DATE OF DISCHARGE:  03/15/2003                                 DISCHARGE SUMMARY   PRIMARY CARE PHYSICIAN:  [name], M.D.   DISCHARGE DIAGNOSES:  1. Inflammatory gastroenteritis.  2. Headache/neck pain.  3. Depression.  4. Hydrocodone abuse.   DISCHARGE MEDICATIONS:  1. Wellbutrin 75 mg p.o. daily x4 days, then increase to 150 mg p.o. daily     x4 days, then increase to 300 mg p.o. daily.  2. Ibuprofen 400 mg p.o. q.4h. p.r.n. headache.   PROCEDURES:  1. On March 13, 2003, head CT:  No acute bleed.  2. On March 13, 2003, lumbar puncture cell count:  WBC's 0, RBC's 349,     glucose 12, protein 23.  Gram stain:  No organisms, no white blood cells,     rare epithelial cells.  CSF culture negative x48 hours.  Viral culture     pending.   FOLLOWUP:  1. The patient is to follow up with Dr. [name] as needed.  No appointments     were available, but the patient may go on Thursday, Friday and Saturday     as directed for the hours when Dr. [name] is working.  2. The patient is to return to the ER or to Dr. [name] clinic if she has     worsening of symptoms, increase in neck pain, neck stiffness or confusion     or change in mental status.   BRIEF HISTORY AND PHYSICAL:  The patient is a 45 year old female with  history of hydrocodone abuse and depression who presented with history of  headache, vomiting, fatigue, neck stiffness, diarrhea and fevers and chills.  The patient had a positive sick contact.  Additionally, the patient  complained of photophobia and phonophobia.   HOSPITAL COURSE:  PROBLEM #1 - HEADACHE/NECK PAIN:  On admission, the  patient had  symptoms that were worrisome for meningitis.  LP revealed no  evidence of viral or bacterial infection, but the patient was treated with  Rocephin for 48 hours until blood cultures were negative.  Additionally,  viral CSF cultures are pending.  The patient's headache and neck pain were  managed with Toradol during the hospital stay and improved greatly.  She did  have one dose of Ultram in he ER.   PROBLEM #2 - INFLAMMATORY GASTROENTERITIS:  We believe that the patient's  vomiting and diarrhea is secondary to either a viral gastroenteritis or  bacterial gastroenteritis.  It has been seen that gastroenteritis can often  have  meningitic symptoms and headache associated with it.  The patient was  made n.p.o. and given Phenergan for nausea.  Additionally, she received IV  fluids.  Gradually, over the course of two days, she was able to tolerate a  regular diet and had good resolution of her emesis and some resolution of  her diarrhea.   PROBLEM #3 - DEPRESSION:  The patient has been treated with Wellbutrin at  300 mg p.o. daily.  The patient had reported that she had not been taking  her Wellbutrin secondary to emesis.  When it was restarted, she was started  at a lower dose and will gradually increase the dose to avoid side effects  that she sees from abrupt restart of Wellbutrin.   PROBLEM #4 - HYDROCODONE ABUSE:  The patient did report recent relapse,  although, she had been clean for a week.  She received one dose of Ultram in  the ER.  She reported this to her sponsor and responded appropriately.  For  the rest of the hospital stay, the patient was not managed with narcotics  secondary to her history of abuse.                                                [name], MD    AW/MEDQ  D:  03/15/2003  T:  03/15/2003  Job:  [id number]   cc:   [name], M.D.  [facility]  [address]  [place]  [place] [address]  Fax: [phone]

## 2010-10-12 NOTE — Op Note (Signed)
Megan Cook, Megan Cook               ACCOUNT NO.:  [id number]   MEDICAL RECORD NO.:  [id number]          PATIENT TYPE:  AMB   LOCATION:  [facility]                         FACILITY:  [facility]   PHYSICIAN:  Sherry A. Dickstein, M.D.DATE OF BIRTH:  [birth year]   DATE OF PROCEDURE:  06/06/2004  DATE OF DISCHARGE:                                 OPERATIVE REPORT   PREOPERATIVE DIAGNOSES:  1.  Desire for sterilization.  2.  Dysmenorrhea.  3.  Menorrhagia.   POSTOPERATIVE DIAGNOSES:  1.  Desire for sterilization.  2.  Dysmenorrhea.  3.  Menorrhagia.  4.  Pelvic congestion syndrome.   PROCEDURES:  1.  Diagnostic laparoscopy.  2.  Laparoscopic tubal cautery.  3.  Hysteroscopy.  4.  Hydrothermablation.  5.  Dilatation and curettage.   SURGEON:  Sherry A. Rosalio Macadamia, M.D.   ANESTHESIA:  General.   INDICATIONS:  This is a 45 year old G2, P2-0-0-2, woman who has had  excessively heavy menstrual periods associated with severe dysmenorrhea and  back pain.  The patient also requests permanent sterilization procedure.  Ultrasound was performed showing a normal endometrial cavity, normal uterus  and ovaries.  Because of no endometrial pathology, a decision was made to  perform a hydrothermablation to try to control her menorrhagia and a tubal  ligation with cautery for her desire for sterilization.  At this time the  patient does not want a hysterectomy.  This procedure is being performed to  avoid this.   FINDINGS:  A normal-sized anteflexed uterus.  Normal fallopian tubes and  ovaries.  A small 1 cm follicular cyst on the right ovary.  Pelvic  congestion syndrome, right greater than left.  Normal endometrium.  Normal  gallbladder and appendix.   PROCEDURE:  The patient was brought into the operating room and given  adequate general endotracheal anesthesia.  She was placed in a dorsal  lithotomy position.  Her abdomen and then her vagina were washed with  Hibiclens.  Pelvic examination was  performed.  The patient was draped in a  sterile fashion.  The subumbilical area was infiltrated with 0.25% Marcaine.  Incision was made.  A dissection was developed under the umbilicus down to  the fascia.  The fascia was identified and grasped with Kocher clamps.  It  was incised.  The peritoneum was identified and opened bluntly.  The fascial  edges were identified and using 0 Vicryl in a pursestring stitch, a Vicryl  stitch was taken.  The Hasson trocar was introduced into the peritoneal  cavity and cinched down with the Vicryl pursestring.  Carbon dioxide was  insufflated, the laparoscope was introduced, and pelvic organs were  identified.  The suprapubic area was infiltrated with 0.25% Marcaine and an  incision made.  A probe was placed.  All of the pelvis was inspected with  the above findings.  The bipolar cautery was placed.  The right fallopian  tube was cauterized in its isthmic-ampullary portion over approximately 2-3  cm.  There was at least 2 cm of normal fallopian tube between the cauterized  portion and the cornu.  This was performed  on the left as well.  The pelvis  was inspected.  There was no endometriosis seen.  It was felt that there  were multiple enlarged blood vessels in the broad ligaments, especially  right greater than left.  The upper abdomen was inspected with no  abnormalities seen.  All carbon dioxide was allowed to escape.  The other  instruments were left in the abdominal cavity.  The surgeon then moved to  the vaginal portion of the case.   The speculum was placed within the vagina.  The cervix was grasped with a  single-tooth tenaculum.  The original single-tooth Hulka tenaculum was  removed.  A paracervical block was given with 0.25% Marcaine.  Using [facility]  dilators, the cervix was dilated to between 19 and 21.  The  HydroThermAblator was then placed into the cervix and with adequate flow, a  hysteroscopy was performed.  Pictures were obtained.  There  were no  abnormalities seen in the endometrial cavity.  The HydroThermAblator was  then set.  It was visualized very carefully.  Packs were in place beneath  the cervix.  There was no leaking of fluid.  The standard HTA procedure was  performed.  A full 10 minutes of flow was present without any significant  leaking of fluid documented by the machine or visually.  Once the fluid was  allowed to cool down, the HTA was removed from the uterus and cervix.  Sharp  curettage was then performed.  The single-tooth Hulka tenaculum was placed  in the endometrial cavity again for one more viewing from above.  Surgeon's  gloves were changed.   The laparoscope was reintroduced into the peritoneal cavity.  Carbon dioxide  was insufflated.  The pelvis was inspected and there was no bleeding, no  signs of perforation, no abnormalities seen.  All carbon dioxide was then  allowed to escape.  The suprapubic trocar had been removed.  The Hasson had  been removed.  The fascia was closed with a 0 Vicryl pursestring stitch that  was in place carefully over a finger to avoid any bowel injury.  The  subumbilical incision was then closed with 4-0 Monocryl in a subcuticular  running stitch.  The subumbilical and suprapubic incisions were then closed  with Dermabond.  Adequate hemostasis was present.  All instruments removed  from the vagina.  The patient was taken out of the dorsal lithotomy  position.  She was awakened.  She was extubated.  She was moved from the  operating table to a stretcher in stable condition.   COMPLICATIONS:  None.   ESTIMATED BLOOD LOSS:  Less than 5 mL.   SPECIMENS:  Endometrial curettings post HTA.     Sher   SAD/MEDQ  D:  06/06/2004  T:  06/06/2004  Job:  [id number]

## 2011-03-07 LAB — CBC
HCT: 44.9
Hemoglobin: 15
MCV: 85.4
RBC: 5.26 — ABNORMAL HIGH
RDW: 14.9 — ABNORMAL HIGH

## 2011-03-07 LAB — COMPREHENSIVE METABOLIC PANEL
ALT: 25
AST: 27
Albumin: 4.2
Alkaline Phosphatase: 81
BUN: 14
Calcium: 10.4
GFR calc Af Amer: >60
Potassium: 4.3

## 2011-03-07 LAB — PROTIME-INR: INR: 0.9

## 2011-03-07 LAB — URINALYSIS, ROUTINE W REFLEX MICROSCOPIC
Bilirubin Urine: NEGATIVE
Glucose, UA: NEGATIVE
Hgb urine dipstick: NEGATIVE
Ketones, ur: NEGATIVE
Nitrite: NEGATIVE
Protein, ur: NEGATIVE
Urobilinogen, UA: 0.2

## 2011-05-07 ENCOUNTER — Other Ambulatory Visit: Payer: Self-pay | Admitting: Obstetrics and Gynecology

## 2011-05-07 DIAGNOSIS — Z1231 Encounter for screening mammogram for malignant neoplasm of breast: Secondary | ICD-10-CM

## 2011-06-05 ENCOUNTER — Ambulatory Visit
Admission: RE | Admit: 2011-06-05 | Discharge: 2011-06-05 | Disposition: A | Discharge status: Home / Self Care | Payer: Self-pay | Source: Ambulatory Visit | Attending: Obstetrics and Gynecology | Admitting: Obstetrics and Gynecology

## 2011-06-05 DIAGNOSIS — Z1231 Encounter for screening mammogram for malignant neoplasm of breast: Secondary | ICD-10-CM

## 2011-06-11 ENCOUNTER — Other Ambulatory Visit: Payer: Self-pay | Admitting: Obstetrics and Gynecology

## 2011-06-11 DIAGNOSIS — R928 Other abnormal and inconclusive findings on diagnostic imaging of breast: Secondary | ICD-10-CM

## 2011-06-19 ENCOUNTER — Ambulatory Visit
Admission: RE | Admit: 2011-06-19 | Discharge: 2011-06-19 | Disposition: A | Discharge status: Home / Self Care | Payer: BC Managed Care – PPO | Source: Ambulatory Visit | Attending: Obstetrics and Gynecology | Admitting: Obstetrics and Gynecology

## 2011-06-19 DIAGNOSIS — R928 Other abnormal and inconclusive findings on diagnostic imaging of breast: Secondary | ICD-10-CM

## 2011-07-16 ENCOUNTER — Ambulatory Visit (INDEPENDENT_AMBULATORY_CARE_PROVIDER_SITE_OTHER): Payer: BC Managed Care – PPO | Admitting: Physician Assistant

## 2011-07-16 VITALS — BP 120/82 | HR 76 | Temp 98.7°F | Resp 16 | Ht 66.0 in | Wt 196.0 lb

## 2011-07-16 DIAGNOSIS — J209 Acute bronchitis, unspecified: Secondary | ICD-10-CM

## 2011-07-16 DIAGNOSIS — R059 Cough, unspecified: Secondary | ICD-10-CM

## 2011-07-16 DIAGNOSIS — J9801 Acute bronchospasm: Secondary | ICD-10-CM

## 2011-07-16 DIAGNOSIS — R05 Cough: Secondary | ICD-10-CM

## 2011-07-16 DIAGNOSIS — E038 Other specified hypothyroidism: Secondary | ICD-10-CM | POA: Insufficient documentation

## 2011-07-16 DIAGNOSIS — E039 Hypothyroidism, unspecified: Secondary | ICD-10-CM

## 2011-07-16 LAB — POCT CBC
Lymph, poc: 1.4 (ref 0.6–3.4)
MCV: 88.1 fL (ref 80–97)
POC Granulocyte: 2.8 (ref 2–6.9)
Platelet Count, POC: 352 10*3/uL (ref 142–424)
WBC: 4.6 10*3/uL (ref 4.6–10.2)

## 2011-07-16 LAB — GLUCOSE, POCT (MANUAL RESULT ENTRY): POC Glucose: 93

## 2011-07-16 MED ORDER — AZITHROMYCIN 250 MG PO TABS: ORAL_TABLET | ORAL | Status: AC

## 2011-07-16 MED ORDER — METHYLPREDNISOLONE ACETATE 80 MG/ML IJ SUSP
80.0000 mg | Freq: Once | INTRAMUSCULAR | Status: AC
  Administered 2011-07-16: 80 mg via INTRAMUSCULAR

## 2011-07-16 MED ORDER — ALBUTEROL SULFATE (2.5 MG/3ML) 0.083% IN NEBU
2.5000 mg | INHALATION_SOLUTION | Freq: Once | RESPIRATORY_TRACT | Status: AC
  Administered 2011-07-16: 2.5 mg via RESPIRATORY_TRACT

## 2011-07-16 NOTE — Progress Notes (Signed)
  Subjective:    Patient ID: Megan Cook, female    DOB: Apr 04, 1966, 46 y.o.   MRN: [id number]  HPI  46 yo CF c/o cough and cold s/sx  .[place] and [place].  Occasionally coughs up mucus.  Fatigue, now having sinus pressure.  +SOB and wheezing. Albuterol causes jitters.  Also, time to check current synthroid dosage.   Review of Systems  All other systems reviewed and are negative.       Objective:   Physical Exam  Nursing note and vitals reviewed. Constitutional: She is oriented to person, place, and time. She appears well-developed and well-nourished.  HENT:  Head: Normocephalic and atraumatic.  Right Ear: External ear normal.  Left Ear: External ear normal.  Mouth/Throat: Oropharynx is clear and moist. No oropharyngeal exudate (mild erythema, PND.).       B ears bulging w/ fluid   Neck: Normal range of motion. Neck supple. No thyromegaly present.  Cardiovascular: Normal rate, regular rhythm and normal heart sounds.  Exam reveals no gallop and no friction rub.   No murmur heard. Pulmonary/Chest: Effort normal. No respiratory distress. She has wheezes (B bases, > with forced expiration). She has no rales. She exhibits no tenderness.  Lymphadenopathy:    She has no cervical adenopathy.  Neurological: She is alert and oriented to person, place, and time. No cranial nerve deficit. Coordination normal.  Skin: Skin is warm and dry.  Psychiatric: She has a normal mood and affect. Her behavior is normal.   Results for orders placed in visit on 07/16/11  POCT CBC      Component Value Range   WBC 4.6  4.6 - 10.2 (K/uL)   Lymph, poc 1.4  0.6 - 3.4    POC LYMPH PERCENT 30.7  10 - 50 (%L)   MID (cbc) 0.4  0 - 0.9    POC MID % 7.7  0 - 12 (%M)   POC Granulocyte 2.8  2 - 6.9    Granulocyte percent 61.6  37 - 80 (%G)   RBC 4.90  4.04 - 5.48 (M/uL)   Hemoglobin 13.8  12.2 - 16.2 (g/dL)   HCT, POC 78.2  95.6 - 47.9 (%)   MCV 88.1  80 - 97 (fL)   MCH, POC 28.2  27 - 31.2 (pg)   MCHC 31.9  31.8 - 35.4 (g/dL)   RDW, POC 21.3     Platelet Count, POC 352  142 - 424 (K/uL)   MPV 8.1  0 - 99.8 (fL)  GLUCOSE, POCT (MANUAL RESULT ENTRY)      Component Value Range   POC Glucose 93         Assessment & Plan:  Fluids, rest.  Sample of Proventil inhaler given bc pt. Responded favorably to breathing treatment. 80IM depo medrol.  Zpack. Pt. Has Tessalon perles at home.

## 2011-07-18 ENCOUNTER — Other Ambulatory Visit: Payer: Self-pay | Admitting: Physician Assistant

## 2011-07-18 MED ORDER — BUPROPION HCL ER (XL) 150 MG PO TB24: 150.0000 mg | ORAL_TABLET | Freq: Every day | ORAL | Status: DC

## 2011-07-18 MED ORDER — PREDNISONE 10 MG PO TABS: ORAL_TABLET | ORAL | Status: DC

## 2011-07-18 MED ORDER — LEVOTHYROXINE SODIUM 25 MCG PO TABS: 25.0000 ug | ORAL_TABLET | Freq: Every day | ORAL | Status: DC

## 2011-09-21 ENCOUNTER — Ambulatory Visit (INDEPENDENT_AMBULATORY_CARE_PROVIDER_SITE_OTHER): Payer: BC Managed Care – PPO | Admitting: Family Medicine

## 2011-09-21 VITALS — BP 116/73 | HR 84 | Temp 98.6°F | Resp 18 | Ht 66.0 in | Wt 195.0 lb

## 2011-09-21 DIAGNOSIS — A084 Viral intestinal infection, unspecified: Secondary | ICD-10-CM

## 2011-09-21 DIAGNOSIS — R112 Nausea with vomiting, unspecified: Secondary | ICD-10-CM

## 2011-09-21 DIAGNOSIS — A088 Other specified intestinal infections: Secondary | ICD-10-CM

## 2011-09-21 DIAGNOSIS — E860 Dehydration: Secondary | ICD-10-CM

## 2011-09-21 DIAGNOSIS — R51 Headache: Secondary | ICD-10-CM

## 2011-09-21 LAB — POCT CBC
Granulocyte percent: 74.4 %G (ref 37–80)
HCT, POC: 42.5 % (ref 37.7–47.9)
Hemoglobin: 14.3 g/dL (ref 12.2–16.2)
Lymph, poc: 0.9 (ref 0.6–3.4)
MCH, POC: 28.9 pg (ref 27–31.2)
MCHC: 33.6 g/dL (ref 31.8–35.4)
MCV: 85.8 fL (ref 80–97)
MID (cbc): 0.2 (ref 0–0.9)
MPV: 9.2 fL (ref 0–99.8)
POC Granulocyte: 3.4 (ref 2–6.9)
POC LYMPH PERCENT: 20.3 % (ref 10–50)
POC MID %: 5.3 % (ref 0–12)
Platelet Count, POC: 216 10*3/uL (ref 142–424)
RBC: 4.95 M/uL (ref 4.04–5.48)
RDW, POC: 13.8 %
WBC: 4.6 10*3/uL (ref 4.6–10.2)

## 2011-09-21 MED ORDER — KETOROLAC TROMETHAMINE 60 MG/2ML IM SOLN
60.0000 mg | Freq: Once | INTRAMUSCULAR | Status: AC
  Administered 2011-09-21: 60 mg via INTRAMUSCULAR

## 2011-09-21 MED ORDER — SODIUM CHLORIDE 0.9 % IV SOLN
25.0000 mg | Freq: Once | INTRAVENOUS | Status: AC
  Administered 2011-09-21: 25 mg via INTRAVENOUS

## 2011-09-21 MED ORDER — PROMETHAZINE HCL 25 MG/ML IJ SOLN
25.0000 mg | Freq: Once | INTRAMUSCULAR | Status: AC
  Administered 2011-09-21: 25 mg via INTRAVENOUS

## 2011-09-21 MED ORDER — PROMETHAZINE HCL 25 MG PO TABS: 25.0000 mg | ORAL_TABLET | Freq: Three times a day (TID) | ORAL | Status: DC | PRN

## 2011-09-21 NOTE — Progress Notes (Signed)
[facility]:  Office Visit  Chief Complaint:  Chief Complaint  Patient presents with  . vomitting    thursday night  . Fever  . Diarrhea    HPI: Megan Cook is a 46 y.o. female who complains of  2 day history of nausea and nonbloody-vomiting. + Frontal HA and generalized weakness. Nonbloody diarrhea x 4, last episode yesterday afternoon. Fever Tmax 100.5. Unable to take in anything for fevers. No sick contacts  Patient coincidentally for amoxacillin since Wednesday afternoon for tooth abscess She is in recovery for rx narcotic abuse Past Medical History  Diagnosis Date  . Thyroid disease   . Depression   . 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 Cesarean section   . Neck fusion   . Left knee   . Tonsillectomy    History   Social History  . Marital Status: Married    Spouse Name: N/A    Number of Children: N/A  . Years of Education: N/A   Social History Main Topics  . Smoking status: Never Smoker   . Smokeless tobacco: None  . Alcohol Use: None  . Drug Use: None  . Sexually Active: None   Other Topics Concern  . None   Social History Narrative  . None   Family History  Problem Relation Age of Onset  . Stroke Mother   . Thyroid disease Mother   . Cancer Father   . Colon cancer Father    Allergies  Allergen Reactions  . Codeine Itching and Nausea Only   Prior to Admission medications   Medication Sig Start Date End Date Taking? Authorizing Provider  buPROPion (WELLBUTRIN XL) 150 MG 24 hr tablet Take 1 tablet (150 mg total) by mouth daily. 07/18/11  Yes Anders Simmonds, PA-C  levothyroxine (SYNTHROID, LEVOTHROID) 25 MCG tablet Take 1 tablet (25 mcg total) by mouth daily. 07/18/11  Yes Anders Simmonds, PA-C  Multiple Vitamin (MULTIVITAMIN) tablet Take 1 tablet by mouth daily.   Yes Historical Provider, MD  predniSONE (DELTASONE) 10 MG tablet 6, 5,4,3,2,1 Take each days dose in  am with food 07/18/11   Anders Simmonds, PA-C     ROS: The patient denies fevers, chills, night sweats, unintentional weight loss, chest pain, palpitations, wheezing, dyspnea on exertion, dysuria, hematuria, melena, numbness, weakness, or tingling. + n/v/abd cramps/diarrhea  All other systems have been reviewed and were otherwise negative with the exception of those mentioned in the HPI and as above.    PHYSICAL EXAM: Filed Vitals:   09/21/11 0806  BP: 116/73  Pulse: 84  Temp: 98.6 F (37 C)  Resp: 18   Filed Vitals:   09/21/11 0806  Height: 5\' 6"  (1.676 m)  Weight: 195 lb (88.451 kg)   Body mass index is 31.47 kg/(m^2).  General: Alert, no acute distress, tired appearing HEENT:  Normocephalic, atraumatic, oropharynx patent. OM dry. TM nl. NO exudates Cardiovascular:  Regular rate and rhythm, no rubs murmurs or gallops.  No Carotid bruits, radial pulse intact. No pedal edema.  Respiratory: Clear to auscultation bilaterally.  No wheezes, rales, or rhonchi.  No cyanosis, no use of accessory musculature GI: No organomegaly, abdomen is soft and non-tender, positive bowel sounds.  No masses. Skin: No rashes. Neurologic: Facial musculature symmetric. Psychiatric: Patient is appropriate throughout our interaction. Lymphatic: No cervical lymphadenopathy Musculoskeletal: Gait intact.   LABS: Results for orders placed in visit  on 09/21/11  POCT CBC      Component Value Range   WBC 4.6  4.6 - 10.2 (K/uL)   Lymph, poc 0.9  0.6 - 3.4    POC LYMPH PERCENT 20.3  10 - 50 (%L)   MID (cbc) 0.2  0 - 0.9    POC MID % 5.3  0 - 12 (%M)   POC Granulocyte 3.4  2 - 6.9    Granulocyte percent 74.4  37 - 80 (%G)   RBC 4.95  4.04 - 5.48 (M/uL)   Hemoglobin 14.3  12.2 - 16.2 (g/dL)   HCT, POC 40.9  81.1 - 47.9 (%)   MCV 85.8  80 - 97 (fL)   MCH, POC 28.9  27 - 31.2 (pg)   MCHC 33.6  31.8 - 35.4 (g/dL)   RDW, POC 91.4     Platelet Count, POC 216  142 - 424 (K/uL)   MPV 9.2  0 - 99.8 (fL)      EKG/XRAY:   Primary read interpreted by Dr. Conley Rolls at [facility].   ASSESSMENT/PLAN: Encounter Diagnoses  Name Primary?  . Viral gastroenteritis Yes  . Nausea & vomiting   . Headache   . Dehydration    1. Most likely viral gastroenteritis -IVFand Promethazine IV given  2. Patient received rx for Promethazine  Prn 3. BRAT diet advance as tolerated, Push fluids   Maniah Nading PHUONG, DO 09/23/2011 10:17 AM

## 2012-06-28 ENCOUNTER — Ambulatory Visit (INDEPENDENT_AMBULATORY_CARE_PROVIDER_SITE_OTHER): Payer: BC Managed Care – PPO | Admitting: Physician Assistant

## 2012-06-28 ENCOUNTER — Telehealth: Payer: Self-pay

## 2012-06-28 VITALS — BP 108/72 | HR 71 | Temp 98.3°F | Resp 16 | Ht 67.9 in | Wt 208.2 lb

## 2012-06-28 DIAGNOSIS — L259 Unspecified contact dermatitis, unspecified cause: Secondary | ICD-10-CM

## 2012-06-28 DIAGNOSIS — L309 Dermatitis, unspecified: Secondary | ICD-10-CM

## 2012-06-28 DIAGNOSIS — R635 Abnormal weight gain: Secondary | ICD-10-CM

## 2012-06-28 DIAGNOSIS — R609 Edema, unspecified: Secondary | ICD-10-CM

## 2012-06-28 DIAGNOSIS — E039 Hypothyroidism, unspecified: Secondary | ICD-10-CM

## 2012-06-28 DIAGNOSIS — R413 Other amnesia: Secondary | ICD-10-CM

## 2012-06-28 DIAGNOSIS — Z23 Encounter for immunization: Secondary | ICD-10-CM

## 2012-06-28 LAB — POCT UA - MICROSCOPIC ONLY
Mucus, UA: NEGATIVE
Yeast, UA: NEGATIVE

## 2012-06-28 LAB — POCT SEDIMENTATION RATE: POCT SED RATE: 11 mm/hr (ref 0–22)

## 2012-06-28 LAB — POCT URINALYSIS DIPSTICK
Bilirubin, UA: NEGATIVE
Blood, UA: NEGATIVE
Ketones, UA: NEGATIVE
Nitrite, UA: NEGATIVE
Spec Grav, UA: 1.02
pH, UA: 6.5

## 2012-06-28 LAB — POCT CBC
Granulocyte percent: 61.8 %G (ref 37–80)
Lymph, poc: 1.5 (ref 0.6–3.4)
MCHC: 31.8 g/dL (ref 31.8–35.4)
MID (cbc): 0.3 (ref 0–0.9)
MPV: 8.1 fL (ref 0–99.8)
POC Granulocyte: 2.8 (ref 2–6.9)
POC LYMPH PERCENT: 32.6 %L (ref 10–50)
POC MID %: 5.6 %M (ref 0–12)
Platelet Count, POC: 344 10*3/uL (ref 142–424)
RDW, POC: 14 %

## 2012-06-28 LAB — COMPREHENSIVE METABOLIC PANEL
AST: 15 U/L (ref 0–37)
Albumin: 4.6 g/dL (ref 3.5–5.2)
Alkaline Phosphatase: 103 U/L (ref 39–117)
Potassium: 4.5 mEq/L (ref 3.5–5.3)
Sodium: 137 mEq/L (ref 135–145)
Total Bilirubin: 0.5 mg/dL (ref 0.3–1.2)
Total Protein: 6.7 g/dL (ref 6.0–8.3)

## 2012-06-28 MED ORDER — TRIAMCINOLONE ACETONIDE 0.025 % EX OINT: TOPICAL_OINTMENT | Freq: Two times a day (BID) | CUTANEOUS | Status: DC

## 2012-06-28 NOTE — Patient Instructions (Addendum)
Handout on eczema given

## 2012-06-28 NOTE — Progress Notes (Signed)
Subjective:    Patient ID: Megan Cook, female    DOB: June 05, 1965, 47 y.o.   MRN: [id number]  HPI 47 yr old female presents with multiple issues. Of note, her son has has had to have knee surgery twice in the last 6 months. They are travelling back and forth to [facility] for PT and his doctor's appointments.  We talked about this for a while and it's effect on her. Also, she has now started working full-time as of January.  1)her energy level has been very low the last couple of months. She is struggling to complete and do workouts. It is time to do her thyroid check. 2)she is experiencing swelling in her lower legs.  She sits at work and does notice it is better on days she doesn't have to work.  She denies any SOB except with increased activity. 3)she has a rash on her lower legs that is only present during the winter months.  She had this about 2 years ago and it went away in the summer.  It is pruritic and on the days she has swelling, it is also painful.  No new soaps or detergents.  Oils and lotions seem to help/soothe it.  Sometimes it weeps serous fluid but not regularly. 4)she stopped her wellbutrin at the end of December bc she takes the name brand and she ran out of coupons.  She doesn't feel like she has felt depressed. 5) she is also having problems with her short-term memory 6)she is having ongoing back pain/spinal stenosis issues and is seeing Dr. Danielle Dess for those.  Review of Systems  All other systems reviewed and are negative.       Objective:   Physical Exam  Nursing note and vitals reviewed. Constitutional: She is oriented to person, place, and time. She appears well-developed and well-nourished.  HENT:  Head: Normocephalic and atraumatic.  Right Ear: External ear normal.  Mouth/Throat: No oropharyngeal exudate.  Neck: Normal range of motion. Neck supple. No thyromegaly present.  Cardiovascular: Normal rate, regular rhythm and normal heart sounds.   Pulmonary/Chest:  Effort normal and breath sounds normal. She has no wheezes. She has no rales.  Lymphadenopathy:    She has no cervical adenopathy.  Neurological: She is alert and oriented to person, place, and time.  Skin: Skin is warm and dry. Rash (lower legs with scattered areas of maculopapular erythema that appear typical of eczema) noted.  Psychiatric: She has a normal mood and affect. Her behavior is normal.   Results for orders placed in visit on 06/28/12  POCT UA - MICROSCOPIC ONLY      Component Value Range   WBC, Ur, HPF, POC 0-4     RBC, urine, microscopic 0-1     Bacteria, U Microscopic trace     Mucus, UA neg     Epithelial cells, urine per micros 0-2     Crystals, Ur, HPF, POC neg     Casts, Ur, LPF, POC neg     Yeast, UA neg    POCT URINALYSIS DIPSTICK      Component Value Range   Color, UA yellow     Clarity, UA cloudy     Glucose, UA neg     Bilirubin, UA neg     Ketones, UA neg     Spec Grav, UA 1.020     Blood, UA neg     pH, UA 6.5     Protein, UA neg     Urobilinogen,  UA 0.2     Nitrite, UA neg     Leukocytes, UA Negative    POCT CBC      Component Value Range   WBC 4.6  4.6 - 10.2 K/uL   Lymph, poc 1.5  0.6 - 3.4   POC LYMPH PERCENT 32.6  10 - 50 %L   MID (cbc) 0.3  0 - 0.9   POC MID % 5.6  0 - 12 %M   POC Granulocyte 2.8  2 - 6.9   Granulocyte percent 61.8  37 - 80 %G   RBC 4.98  4.04 - 5.48 M/uL   Hemoglobin 13.8  12.2 - 16.2 g/dL   HCT, POC 16.1  09.6 - 47.9 %   MCV 87.1  80 - 97 fL   MCH, POC 27.7  27 - 31.2 pg   MCHC 31.8  31.8 - 35.4 g/dL   RDW, POC 04.5     Platelet Count, POC 344  142 - 424 K/uL   MPV 8.1  0 - 99.8 fL       Assessment & Plan:  Hypothyroidism/Severe fatigue/weight gain-checking TSH to see if we need to adjust levels. This could also be contributing to dry skin.  Subjective Short term memory loss-recall and mental status seem intact.  I am checking labs and will mail her copies and will also have her address this with Dr. Danielle Dess.  I  think the memory problems she is having are "normal" given the amount of stressors and things she has to keep up with in her life(15 yr old son with multiple health issues including mood disorder/depression/2 recent surgeries., also has a daughter and husband at home and working full time and active in recovery! She does have a lot going on! Dependent edema-compression stockings, elevate in the evenings, drink plenty of water and I am checking labs. Ok to stay off wellbutrin for now, although this could be contributing to her level of fatigue. Spent 50 mins face to face.

## 2012-06-28 NOTE — Telephone Encounter (Signed)
[facility] is calling to see about changing an rx for this patient   Just saw Megan Cook

## 2012-06-29 LAB — VITAMIN D 25 HYDROXY (VIT D DEFICIENCY, FRACTURES): Vit D, 25-Hydroxy: 39 ng/mL (ref 30–89)

## 2012-06-29 NOTE — Telephone Encounter (Signed)
[facility] has already filled and patient has picked up.

## 2012-06-29 NOTE — Telephone Encounter (Signed)
Which medication? Left message for [facility] to call me back.

## 2012-07-01 MED ORDER — LEVOTHYROXINE SODIUM 25 MCG PO TABS: 25.0000 ug | ORAL_TABLET | Freq: Every day | ORAL | Status: DC

## 2012-07-01 NOTE — Addendum Note (Signed)
Addended by: Anders Simmonds on: 07/01/2012 09:31 AM   Modules accepted: Orders

## 2012-09-02 ENCOUNTER — Other Ambulatory Visit: Payer: Self-pay | Admitting: Internal Medicine

## 2012-09-02 NOTE — Telephone Encounter (Signed)
Called pt to clarify that she is currently taking the Wellbutrin b/c in Feb had spoken w/Angela about not taking it. Pt stated that when her TSH came back normal, Marylene Land had d/w her that fatigue may be due to lack of wellbutrin. Pt started taking it again at that time and is taking 150 mg generic and doing well on it. I will RF Rx as requested.

## 2012-10-13 LAB — BASIC METABOLIC PANEL: Creatinine: 0.8 mg/dL (ref <=1.1)

## 2012-10-14 LAB — BASIC METABOLIC PANEL
BUN: 19 mg/dL (ref 4–21)
Glucose: 88 mg/dL
Sodium: 140 mmol/L (ref 137–147)

## 2012-10-14 LAB — HEPATIC FUNCTION PANEL: Bilirubin, Total: 0.2 mg/dL

## 2012-10-20 ENCOUNTER — Encounter: Payer: Self-pay | Admitting: Physician Assistant

## 2012-10-20 ENCOUNTER — Ambulatory Visit (INDEPENDENT_AMBULATORY_CARE_PROVIDER_SITE_OTHER): Payer: BC Managed Care – PPO | Admitting: Family Medicine

## 2012-10-20 ENCOUNTER — Other Ambulatory Visit: Payer: Self-pay | Admitting: Physician Assistant

## 2012-10-20 ENCOUNTER — Ambulatory Visit: Payer: BC Managed Care – PPO

## 2012-10-20 VITALS — BP 122/76 | HR 75 | Temp 98.2°F | Resp 16 | Ht 66.25 in | Wt 213.8 lb

## 2012-10-20 DIAGNOSIS — R600 Localized edema: Secondary | ICD-10-CM

## 2012-10-20 DIAGNOSIS — E039 Hypothyroidism, unspecified: Secondary | ICD-10-CM

## 2012-10-20 DIAGNOSIS — R635 Abnormal weight gain: Secondary | ICD-10-CM

## 2012-10-20 DIAGNOSIS — R609 Edema, unspecified: Secondary | ICD-10-CM

## 2012-10-20 LAB — POCT CBC
Granulocyte percent: 59 %G (ref 37–80)
HCT, POC: 40.9 % (ref 37.7–47.9)
Hemoglobin: 13.1 g/dL (ref 12.2–16.2)
MPV: 7.8 fL (ref 0–99.8)
POC Granulocyte: 2.7 (ref 2–6.9)
POC LYMPH PERCENT: 33.3 %L (ref 10–50)
POC MID %: 7.7 %M (ref 0–12)
RDW, POC: 13.9 %

## 2012-10-20 LAB — COMPREHENSIVE METABOLIC PANEL
ALT: 18 U/L (ref 0–35)
AST: 20 U/L (ref 0–37)
Albumin: 4.6 g/dL (ref 3.5–5.2)
Calcium: 9.6 mg/dL (ref 8.4–10.5)
Chloride: 106 mEq/L (ref 96–112)
Potassium: 4.4 mEq/L (ref 3.5–5.3)

## 2012-10-20 LAB — TESTOSTERONE, FREE, DIRECT: Testosterone, Free.: 0.3

## 2012-10-20 LAB — FOLLICLE STIMULATING HORMONE: FSH: 67

## 2012-10-20 MED ORDER — HYDROCHLOROTHIAZIDE 25 MG PO TABS: 25.0000 mg | ORAL_TABLET | Freq: Every day | ORAL | Status: DC

## 2012-10-20 MED ORDER — MEDICAL COMPRESSION SOCKS MISC: 1.0000 [IU] | Freq: Every day | Status: DC

## 2012-10-20 NOTE — Progress Notes (Signed)
   [address] [place] [address]   Phone [phone]  Subjective:    Patient ID: Megan Cook, female    DOB: June 29, 1965, 47 y.o.   MRN: [id number]  HPI  Pt presents to clinic with 2 concerns 1- had labs done at the GYN and was told her liver enzymes were elevated.  She is worried because in the past she has overused tylenol products.  She has never been told they were high in the past.  She was also told that she is menopausal and her testosterone is low.  She plans to return to see the gyn for the [facility] and testosterone evaluations and treatments.  She would like [place] to help with the LFTs elevation.  She brought her labs today for comparison. 2- she is still having problems with weight gain and swelling.  She feels like her legs are swollen all the time, they feel tight and though they go down slightly at night that never completely get to normal size.  She is frustrated with her weight.  She works out 4-5x/ week, eats really healthy drinks only water about (64oz a day) and is still gaining weight.  She has self esteem issues related to her weight and is already having problems with her libido and this is not helping. She has not tried support hose yet.  She does not believe that they swelling has gotten worse since it has gotten warm outside.  Her legs are the worse but at times even her abd and arms feels swollen. She eats no additional salt and almost no pre-packaged foods but when she does the swelling worsens.  Review of Systems  Respiratory: Negative for cough and shortness of breath.   Cardiovascular: Positive for leg swelling. Negative for chest pain.  Gastrointestinal: Negative for abdominal pain.       Objective:   Physical Exam  Vitals reviewed. Constitutional: She appears well-developed and well-nourished.  HENT:  Head: Normocephalic and atraumatic.  Right Ear: External ear normal.  Left Ear: External ear normal.  Cardiovascular: Normal rate, regular rhythm and normal  heart sounds.   No murmur heard. Legs appear swollen, no pitting edema.  Pulmonary/Chest: Effort normal and breath sounds normal.  Neurological: She is alert.  Skin: Skin is warm and dry.  Psychiatric: She has a normal mood and affect. Her behavior is normal. Judgment and thought content normal.     [facility] reading (PRIMARY) by  Dr. Conley Rolls.  Normal. EKG - SR with no acute changes.     Assessment & Plan:  Bilateral leg edema I suspect that her edema is related to weight and is a dependant edema and not related to her heart with a normal CXR and EKG  Will do a trial of compression thigh highs and diuretic and will rehedck the pain in 1 month-- Plan: Comprehensive metabolic panel, EKG 12-Lead, DG Chest 2 View, TSH, POCT CBC, hydrochlorothiazide (HYDRODIURIL) 25 MG tablet -- if she has no improved I will do a referral to the veins specialist for a 2nd opinion.  Weight gain - will check her thyroid - she takes generic medication but I expect this to be normal.  Unspecified hypothyroidism - check labs  Elevated alk phos- This was normal in 2/14 so we will recheck and expect it to be normal.  Benny Lennert PA-C 10/20/2012 2:54 PM

## 2012-10-21 ENCOUNTER — Telehealth: Payer: Self-pay

## 2012-10-21 NOTE — Telephone Encounter (Signed)
Megan Cook,  Patient has seen no change with the medication.   She is asking for something else.  [employer]    [phone]

## 2012-10-23 ENCOUNTER — Telehealth: Payer: Self-pay | Admitting: Radiology

## 2012-10-23 DIAGNOSIS — R600 Localized edema: Secondary | ICD-10-CM

## 2012-10-23 NOTE — Telephone Encounter (Signed)
Please advise on the labs/patient has elevated TSH. She has gyn appt next week and is to have her parathyroid hormone checked. She also states the HCTZ is not helping her lower ext edema. She states she discussed with sarah if the HCTZ med did not work another diuretic would be tried. Please advise.

## 2012-10-23 NOTE — Telephone Encounter (Signed)
xyz

## 2012-10-25 NOTE — Telephone Encounter (Signed)
TSH was normal.  Alk. Phos was elevated, and request for fractionation added.  At OV on 10/20/2012, patient was to try diuretic and thigh high compression stockings x 1 month.  If no improvement, the plan was to refer her to a vein specialist. I do  ot see that Maralyn Sago planned to try a different diuretic, but we can route the message to her if the patient wishes.

## 2012-10-26 LAB — OTHER SOLSTAS TEST

## 2012-10-26 NOTE — Telephone Encounter (Signed)
She can try and take 2 pills to see if that makes a difference.  Not everybody gets the increased urination but lets try to see if this helps.

## 2012-10-26 NOTE — Telephone Encounter (Signed)
Gave pt status of labs. Pt reports that she has been wearing the stockings and that she may have seen just a little improvement, but still swollen and puffy. She states that Maralyn Sago had advised that she would be urinating more frequently and she doesn't see any increase. She feels that this diuretic is not working well and requests trial of one of the other types that Maralyn Sago had mentioned at OV.

## 2012-10-27 MED ORDER — HYDROCHLOROTHIAZIDE 25 MG PO TABS: 50.0000 mg | ORAL_TABLET | Freq: Every day | ORAL | Status: DC

## 2012-10-27 NOTE — Telephone Encounter (Signed)
Called her to advise. Higher dose sent to pharmacy, so it will be documented and when she runs out she cna get this.

## 2012-10-30 ENCOUNTER — Telehealth: Payer: Self-pay

## 2012-10-30 NOTE — Telephone Encounter (Signed)
Discussed with Herbert Seta and she advised to get information on how her swelling is doing in her legs. Find out how she is doing. Also, she stated to decrease the medication down to 1 QD instead of BID.  I tried contacting pt to follow up with her call, left message on her machine.

## 2012-10-30 NOTE — Telephone Encounter (Signed)
Pt states that the hydrochlorothiazide is causing her to have headaches, and makes her feel as if she is going to pass out. Pt would like to know what she should do. Best# [phone]

## 2012-10-31 NOTE — Telephone Encounter (Signed)
She tried once daily, this did not help she had increased swelling yesterday after she d/c the HCTZ. She indicated even at once daily dose, she was" wiped out".

## 2012-11-02 ENCOUNTER — Telehealth: Payer: Self-pay | Admitting: Family Medicine

## 2012-11-02 DIAGNOSIS — R609 Edema, unspecified: Secondary | ICD-10-CM

## 2012-11-02 MED ORDER — FUROSEMIDE 20 MG PO TABS: 20.0000 mg | ORAL_TABLET | Freq: Every day | ORAL | Status: DC

## 2012-11-02 NOTE — Telephone Encounter (Signed)
LM to call me back so we can discuss leg swelling.

## 2012-11-02 NOTE — Telephone Encounter (Signed)
Spoke with patient about labs, also spoke to her about edema in bilateral LE. She took HCTZ 25 mg did not help , then increased to 50 mg and that made her dizzy and did not help with LE swelling. She has been using compression stockings. Will switch to Lasix 20 mg daily. F/u with [facility] Dec in 4 weeks. D/w SEs of meds. DC HCTZ

## 2012-11-06 ENCOUNTER — Telehealth: Payer: Self-pay

## 2012-11-06 NOTE — Telephone Encounter (Signed)
Patient calling about blood work that she had done a few weeks ago. [phone]

## 2012-11-07 NOTE — Telephone Encounter (Signed)
Sarah, please review. Thanks!  

## 2012-11-09 NOTE — Telephone Encounter (Signed)
Labs looked ok.  Alk phos was still slightly elevated but the breakdown was normal so it is not something that I am worried about.  We will just keep an eye on it.

## 2012-11-09 NOTE — Telephone Encounter (Signed)
lmom to cb. 

## 2012-11-10 NOTE — Telephone Encounter (Signed)
Left message to return call 

## 2012-11-11 NOTE — Telephone Encounter (Signed)
Unable to reach patient, letter sent.

## 2012-11-16 ENCOUNTER — Telehealth: Payer: Self-pay

## 2012-11-16 NOTE — Telephone Encounter (Signed)
PT STATES SHE HAD SIGNED FOR [place] TO SEND HER LAST OV NOTES TO [address] AT [facility]N PLEASE CALL PT AT [phone] WHEN DONE

## 2012-11-17 ENCOUNTER — Ambulatory Visit (INDEPENDENT_AMBULATORY_CARE_PROVIDER_SITE_OTHER): Payer: BC Managed Care – PPO | Admitting: Family Medicine

## 2012-11-17 VITALS — BP 113/75 | HR 73 | Temp 98.2°F | Resp 16 | Ht 67.0 in | Wt 212.0 lb

## 2012-11-17 DIAGNOSIS — F329 Major depressive disorder, single episode, unspecified: Secondary | ICD-10-CM

## 2012-11-17 DIAGNOSIS — F32A Depression, unspecified: Secondary | ICD-10-CM

## 2012-11-17 DIAGNOSIS — R609 Edema, unspecified: Secondary | ICD-10-CM

## 2012-11-17 DIAGNOSIS — E039 Hypothyroidism, unspecified: Secondary | ICD-10-CM

## 2012-11-17 LAB — COMPREHENSIVE METABOLIC PANEL WITH GFR
ALT: 25 U/L (ref 0–35)
AST: 24 U/L (ref 0–37)
Albumin: 4.4 g/dL (ref 3.5–5.2)
Alkaline Phosphatase: 121 U/L — ABNORMAL HIGH (ref 39–117)
BUN: 19 mg/dL (ref 6–23)
CO2: 28 meq/L (ref 19–32)
Calcium: 10 mg/dL (ref 8.4–10.5)
Chloride: 101 meq/L (ref 96–112)
Creat: 0.87 mg/dL (ref 0.50–1.10)
Glucose, Bld: 97 mg/dL (ref 70–99)
Potassium: 4.1 meq/L (ref 3.5–5.3)
Sodium: 140 meq/L (ref 135–145)
Total Bilirubin: 0.4 mg/dL (ref 0.3–1.2)
Total Protein: 7.3 g/dL (ref 6.0–8.3)

## 2012-11-17 MED ORDER — LEVOTHYROXINE SODIUM 25 MCG PO TABS: 25.0000 ug | ORAL_TABLET | Freq: Every day | ORAL | Status: DC

## 2012-11-17 MED ORDER — BUPROPION HCL ER (XL) 150 MG PO TB24: 150.0000 mg | ORAL_TABLET | Freq: Every day | ORAL | Status: DC

## 2012-11-17 MED ORDER — FUROSEMIDE 20 MG PO TABS: 20.0000 mg | ORAL_TABLET | Freq: Two times a day (BID) | ORAL | Status: DC

## 2012-11-17 NOTE — Progress Notes (Signed)
   [address], [place] [address]   Phone [phone]   Subjective:    Patient ID: Megan Cook, female    DOB: December 31, 1965, 47 y.o.   MRN: [id number]  HPI Pt presents to clinic for a recheck.  She has increased her Lasix to 20mg  bid and feels like that helps the swelling the most but she still feels swollen.  She is exercising and eating as she has been doing.  She has noticed that her mood is more flat over the last couple of month and this is the same timing as her swelling increased and when she thought back she thinks that her Wellbutrin was changed to generic about that time due to an insurance change.  Her sisters wonder if she could have Cushings.  She wears the compression socks but they are really hot and she did not feel like they helped a lot.    Review of Systems  Constitutional: Negative for fever and chills.  Respiratory: Negative for cough and shortness of breath.   Cardiovascular: Positive for leg swelling. Negative for chest pain.       Objective:   Physical Exam  Vitals reviewed. Constitutional: She appears well-developed and well-nourished.  Pulmonary/Chest: Effort normal.  Musculoskeletal:       Right lower leg: She exhibits edema (non-pitting edema ).       Left lower leg: She exhibits edema (non-pitting edema ).  Pt has bigger arms and legs and then thinner wrists and ankles.  Skin: Skin is warm and dry.  Psychiatric: She has a normal mood and affect. Her behavior is normal. Judgment and thought content normal.       Assessment & Plan:  Unspecified hypothyroidism - will change to brand med in hopes to get better control- Plan: levothyroxine (SYNTHROID, LEVOTHROID) 25 MCG tablet  Edema - continue current medications but look into causes of her edema through the vein specialists. - Plan: Comprehensive metabolic panel, Cortisol-am, blood, Ambulatory referral to Vascular Surgery, furosemide (LASIX) 20 MG tablet  Depression -Will change to brand and see if  we can get better control of her depression and improve her mood and at the same time see if her swelling might be related. Plan: buPROPion (WELLBUTRIN XL) 150 MG 24 hr tablet  D/w Dr Patsy Lager.  Benny Lennert PA-C 11/17/2012 10:16 AM

## 2012-11-19 NOTE — Telephone Encounter (Signed)
Faxed through Epic by Elease Hashimoto on 11/18/12

## 2012-12-08 ENCOUNTER — Encounter: Payer: Self-pay | Admitting: *Deleted

## 2012-12-16 ENCOUNTER — Other Ambulatory Visit: Payer: Self-pay | Admitting: Physician Assistant

## 2012-12-24 ENCOUNTER — Other Ambulatory Visit: Payer: Self-pay | Admitting: Physician Assistant

## 2013-01-18 ENCOUNTER — Other Ambulatory Visit: Payer: Self-pay | Admitting: Physician Assistant

## 2013-01-24 ENCOUNTER — Ambulatory Visit (INDEPENDENT_AMBULATORY_CARE_PROVIDER_SITE_OTHER): Payer: BC Managed Care – PPO | Admitting: Emergency Medicine

## 2013-01-24 ENCOUNTER — Telehealth: Payer: Self-pay

## 2013-01-24 ENCOUNTER — Ambulatory Visit: Payer: BC Managed Care – PPO

## 2013-01-24 VITALS — BP 122/74 | HR 65 | Temp 98.0°F | Resp 17 | Ht 67.0 in | Wt 214.0 lb

## 2013-01-24 DIAGNOSIS — R635 Abnormal weight gain: Secondary | ICD-10-CM

## 2013-01-24 DIAGNOSIS — R079 Chest pain, unspecified: Secondary | ICD-10-CM

## 2013-01-24 DIAGNOSIS — R0602 Shortness of breath: Secondary | ICD-10-CM

## 2013-01-24 LAB — POCT CBC
HCT, POC: 40.2 % (ref 37.7–47.9)
Lymph, poc: 1.6 (ref 0.6–3.4)
MCH, POC: 28.9 pg (ref 27–31.2)
MCHC: 32.8 g/dL (ref 31.8–35.4)
MCV: 88 fL (ref 80–97)
MID (cbc): 0.3 (ref 0–0.9)
POC LYMPH PERCENT: 34.1 %L (ref 10–50)
Platelet Count, POC: 316 10*3/uL (ref 142–424)
RDW, POC: 14.4 %
WBC: 4.8 10*3/uL (ref 4.6–10.2)

## 2013-01-24 LAB — POCT UA - MICROSCOPIC ONLY
Casts, Ur, LPF, POC: NEGATIVE
Crystals, Ur, HPF, POC: NEGATIVE
Yeast, UA: NEGATIVE

## 2013-01-24 LAB — POCT URINALYSIS DIPSTICK
Bilirubin, UA: NEGATIVE
Protein, UA: NEGATIVE
Spec Grav, UA: 1.025
pH, UA: 6.5

## 2013-01-24 LAB — D-DIMER, QUANTITATIVE: D-Dimer, Quant: 0.27 ug/mL-FEU (ref 0.00–0.48)

## 2013-01-24 NOTE — Telephone Encounter (Signed)
Pt is calling to see if lab work is back in and wants to know if she needs to get a CT Scan Please call pt to advise [phone] where tp can be reached

## 2013-01-24 NOTE — Progress Notes (Signed)
Subjective:    Patient ID: Megan Cook, female    DOB: [birth year], 47 y.o.   MRN: [id number]  HPI  47 year old caucasian female presents to clinic with complaints of shortness of breath, LUE swelling, heavy-strange feeling.  Pt had started hormone patches, changed to name brand thyroid /synthroid and Wellbutrin. Pt complains of weight gain since February.  Pt was cleared by vein physician as without DVT.  PT complains of fatigue and unable to exercise at the gym as usual-has had pain in left shoulder blade.  Awakes at night being unable to get a good breath. Her symptoms of swelling have been going on since February. She went to see Dr. Adria Devon initially there was some concern about Cushing's disease however confirmatory testing was negative. She has been to the pain specialist who has told her she does not have any venous disease of her lower extremities. Tarted on hormone therapy for menopausal symptoms and after starting therapy developed chest discomfort dyspnea on exertion and shortness of breath with minimal activity. At times her chest pain is associated with pain up into the left shoulder area.    Review of Systems     Objective:   Physical Exam patient is alert and cooperative she is in no distress. Her neck is supple. She has a clammy feeling to her skin. Clear to auscultation her heart is regular rate and there are no murmurs rubs or gallops appreciated. The abdomen is soft liver spleen not enlarged. There is a puffiness of both lower extremities thighs and calves but the feet are spared and there is no edema in the feet.  EKG  sinus rhythm [facility] reading (PRIMARY) by  Dr. Cleta Alberts no acute disease Results for orders placed in visit on 01/24/13  POCT CBC      Result Value Range   WBC 4.8  4.6 - 10.2 K/uL   Lymph, poc 1.6  0.6 - 3.4   POC LYMPH PERCENT 34.1  10 - 50 %L   MID (cbc) 0.3  0 - 0.9   POC MID % 7.2  0 - 12 %M   POC Granulocyte 2.8  2 - 6.9   Granulocyte percent 58.7  37  - 80 %G   RBC 4.57  4.04 - 5.48 M/uL   Hemoglobin 13.2  12.2 - 16.2 g/dL   HCT, POC 62.9  52.8 - 47.9 %   MCV 88.0  80 - 97 fL   MCH, POC 28.9  27 - 31.2 pg   MCHC 32.8  31.8 - 35.4 g/dL   RDW, POC 41.3     Platelet Count, POC 316  142 - 424 K/uL   MPV 7.8  0 - 99.8 fL  POCT URINALYSIS DIPSTICK      Result Value Range   Color, UA orange     Clarity, UA clear     Glucose, UA neg     Bilirubin, UA neg     Ketones, UA eng     Spec Grav, UA 1.025     Blood, UA neg     pH, UA 6.5     Protein, UA neg     Urobilinogen, UA 0.2     Nitrite, UA neg     Leukocytes, UA Negative    POCT UA - MICROSCOPIC ONLY      Result Value Range   WBC, Ur, HPF, POC 0-2     RBC, urine, microscopic neg     Bacteria, U Microscopic trace  Mucus, UA mod     Epithelial cells, urine per micros 0-4     Crystals, Ur, HPF, POC neg     Casts, Ur, LPF, POC neg     Yeast, UA neg          Assessment & Plan:  This does not appear to be fluid related peripheral edema. This appears to be either adipose tissue or possibly lymphatic related. Appointment will be made to see a cardiologist for stress testing and echocardiogram. A d-dimer was done . The patient was ambulated in the hall and did not desaturate. Maximum heart rate was 110 she was able to ambulate in the hall and do one flight of steps after 1 minute rest period her heart rate declined from 96-72 . We'll get cardiac evaluation first following this may need to get a second opinion from [place] .

## 2013-01-24 NOTE — Telephone Encounter (Signed)
Pt notified of lab results that per Dr. Cleta Alberts they were all normal

## 2013-03-21 ENCOUNTER — Other Ambulatory Visit: Payer: Self-pay | Admitting: Physician Assistant

## 2013-03-29 ENCOUNTER — Other Ambulatory Visit: Payer: Self-pay

## 2013-03-29 DIAGNOSIS — Z1231 Encounter for screening mammogram for malignant neoplasm of breast: Secondary | ICD-10-CM

## 2013-04-06 ENCOUNTER — Ambulatory Visit
Admission: RE | Admit: 2013-04-06 | Discharge: 2013-04-06 | Disposition: A | Discharge status: Home / Self Care | Payer: BC Managed Care – PPO | Source: Ambulatory Visit

## 2013-04-06 DIAGNOSIS — Z1231 Encounter for screening mammogram for malignant neoplasm of breast: Secondary | ICD-10-CM

## 2013-04-26 ENCOUNTER — Other Ambulatory Visit: Payer: Self-pay | Admitting: Physician Assistant

## 2013-04-28 ENCOUNTER — Ambulatory Visit (INDEPENDENT_AMBULATORY_CARE_PROVIDER_SITE_OTHER): Payer: BC Managed Care – PPO | Admitting: Emergency Medicine

## 2013-04-28 VITALS — BP 125/80 | HR 80 | Temp 98.9°F | Resp 18 | Wt 215.0 lb

## 2013-04-28 DIAGNOSIS — F32A Depression, unspecified: Secondary | ICD-10-CM

## 2013-04-28 DIAGNOSIS — F329 Major depressive disorder, single episode, unspecified: Secondary | ICD-10-CM

## 2013-04-28 MED ORDER — WELLBUTRIN XL 150 MG PO TB24: ORAL_TABLET | ORAL | Status: DC

## 2013-04-28 NOTE — Progress Notes (Signed)
Subjective:    Patient ID: Megan Cook, female    DOB: 10/12/1965, 47 y.o.   MRN: [id number] This chart was scribed for Collene Gobble, MD by Valera Castle, ED Scribe. This patient was seen in room 9 and the patient's care was started at 8:31 AM.  Chief Complaint  Patient presents with  . Depression    needs refill of Wellbutrin    HPI Megan Cook is a 47 y.o. female with a h/o depression who presents to the [facility] complaining of a Rx refill of her 150 mg dose of Wellbutrin. She states the 150 mg dose has been working well for her. She states she takes her Lasix periodically.   She states she has seen Dr. Nadara Eaton and was referred down to [facility].   She also states seeing a Dr. Sondra Come and being diagnosed with Hashimoto's Disease. She reports her Synthroid was bumped up to 100 mcg. She states she is going next week for blood work. She reports that before she saw Dr. Sherri Rad her lethargy and memory loss was exacerbated. She reports that recently those symptoms have subsided significantly. She states her hair is still falling out and states she is unable to lose weight.   She also reports today having a sinus headache.   She denies any other associated symptoms.  PCP - Evette Georges, MD  Patient Active Problem List   Diagnosis Date Noted  . Unspecified hypothyroidism 07/16/2011  . DEPRESSION 04/04/2008   Past Medical History  Diagnosis Date  . Thyroid disease   . Depression   . Heart murmur   . 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 Cesarean section    . Neck fusion    . Left knee    . Tonsillectomy     Allergies  Allergen Reactions  . Codeine Itching and Nausea Only   Prior to Admission medications   Medication Sig Start Date End Date Taking? Authorizing Provider  furosemide (LASIX) 20 MG tablet TAKE 1 TABLET TWICE DAILY. 12/24/12  Yes Jessica C Copland, MD  levothyroxine  (SYNTHROID, LEVOTHROID) 100 MCG tablet Take 100 mcg by mouth daily before breakfast.   Yes Historical Provider, MD  levothyroxine (SYNTHROID, LEVOTHROID) 25 MCG tablet Take 100 mcg by mouth daily. 11/17/12 04/28/13 Yes Sarah Harvie Bridge, PA-C  Multiple Vitamin (MULTIVITAMIN) tablet Take 1 tablet by mouth daily.   Yes Historical Provider, MD  WELLBUTRIN XL 150 MG 24 hr tablet TAKE 1 TABLET ONCE DAILY. 04/26/13  Yes Nelva Nay, PA-C    Review of Systems  Constitutional: Positive for activity change (decreased).  HENT:       Positive for hair loss.  Neurological: Positive for headaches.  Psychiatric/Behavioral: Positive for dysphoric mood.    Triage Vitals: BP 125/80  Pulse 80  Temp(Src) 98.9 F (37.2 C) (Oral)  Resp 18  Wt 215 lb (97.523 kg)  SpO2 99%  LMP 07/13/2011     Objective:   Physical Exam Nursing note and vitals reviewed. Constitutional: Pt is oriented to person, place, and time. Pt appears well-developed and well-nourished. No distress.  HENT:  Head: Normocephalic and atraumatic.  Eyes: EOM are normal. Pupils are equal, round, and reactive to light.  Neck: Neck supple. No tracheal deviation present.  Cardiovascular: Normal rate, regular rhythm and normal heart sounds.  Exam reveals no gallop and no friction rub. No murmur heard. Pulmonary/Chest: Effort  normal and breath sounds normal. No respiratory distress. Pt has no wheezes. Pt has no rales.  Abdominal: Soft. Bowel sounds are normal. There is no tenderness. There is no rebound and no guarding.  Musculoskeletal: Normal range of motion.  Neurological: Pt is alert and oriented to person, place, and time.  Skin: Skin is warm and dry.  Psychiatric: Pt has a normal mood and affect. Pt's behavior is normal.  There is lymphedema of both lower extremities     Assessment & Plan:  Patient currently under treatment for Hashimoto's thyroiditis. She is followed in the metabolic clinic at [facility]. She underwent a thorough evaluation  by Dr. Newt Lukes and has a normal cardiac status. I did refill her Wellbutrin today and requested her records from [facility] regarding her workup.     I personally performed the services described in this documentation, which was scribed in my presence. The recorded information has been reviewed and is accurate.

## 2013-07-04 ENCOUNTER — Ambulatory Visit (INDEPENDENT_AMBULATORY_CARE_PROVIDER_SITE_OTHER): Payer: BC Managed Care – PPO | Admitting: Emergency Medicine

## 2013-07-04 VITALS — BP 122/70 | HR 91 | Temp 98.3°F | Resp 18 | Ht 66.5 in | Wt 212.0 lb

## 2013-07-04 DIAGNOSIS — R05 Cough: Secondary | ICD-10-CM

## 2013-07-04 DIAGNOSIS — J3489 Other specified disorders of nose and nasal sinuses: Secondary | ICD-10-CM

## 2013-07-04 DIAGNOSIS — R059 Cough, unspecified: Secondary | ICD-10-CM

## 2013-07-04 DIAGNOSIS — J029 Acute pharyngitis, unspecified: Secondary | ICD-10-CM

## 2013-07-04 LAB — POCT INFLUENZA A/B
INFLUENZA A, POC: NEGATIVE
Influenza B, POC: NEGATIVE

## 2013-07-04 MED ORDER — AZELASTINE HCL 0.1 % NA SOLN: 2.0000 | Freq: Two times a day (BID) | NASAL | Status: DC

## 2013-07-04 MED ORDER — AMOXICILLIN 875 MG PO TABS: 875.0000 mg | ORAL_TABLET | Freq: Two times a day (BID) | ORAL | Status: DC

## 2013-07-04 MED ORDER — BENZONATATE 200 MG PO CAPS: 200.0000 mg | ORAL_CAPSULE | Freq: Two times a day (BID) | ORAL | Status: DC | PRN

## 2013-07-04 NOTE — Patient Instructions (Signed)

## 2013-07-04 NOTE — Progress Notes (Signed)
Subjective:    Patient ID: Megan Cook, female    DOB: 1966-02-12, 48 y.o.   MRN: [id number]  HPI This chart was scribed for Viviann SpareSteven Nicola Heinemann-MD, by Ladona Ridgelaylor Day, Scribe. This patient was seen in room 3 and the patient's care was started at 8:20 AM.  HPI Comments: Megan Cook is a 48 y.o. female who presents to the [facility] complaining of constant, gradually worsening green-colored cough/rhinorrhea, sinus pressure and fatigue over the past week. She reports family members at home w/similar sx as well positive flu; she denies myalgias. She reports associated chest tightness when coughing. She states no relief w/mucinex or tessalon pearls. She reports green colored d/c from both coughing and her nose.  She is not a smoker. She has no allergies to medicines  Patient Active Problem List   Diagnosis Date Noted  . Unspecified hypothyroidism 07/16/2011  . DEPRESSION 04/04/2008   Past Surgical History  Procedure Laterality Date  . Cesarean section    . Neck fusion    . Left knee    . Tonsillectomy     Family History  Problem Relation Age of Onset  . Stroke Mother   . Thyroid disease Mother   . Cancer Father   . Colon cancer Father   . Thyroid disease Sister   . Thyroid disease Sister    History   Social History  . Marital Status: Married    Spouse Name: N/A    Number of Children: N/A  . Years of Education: N/A   Occupational History  . Not on file.   Social History Main Topics  . Smoking status: Never Smoker   . Smokeless tobacco: Not on file  . Alcohol Use: No  . Drug Use: No  . Sexual Activity: Yes    Birth Control/ Protection: Surgical   Other Topics Concern  . Not on file   Social History Narrative  . No narrative on file   Allergies  Allergen Reactions  . Codeine Itching and Nausea Only   Results for orders placed in visit on 01/24/13  D-DIMER, QUANTITATIVE      Result Value Range   D-Dimer, Quant 0.27  0.00 - 0.48 ug/mL-FEU    POCT CBC      Result Value Range   WBC 4.8  4.6 - 10.2 K/uL   Lymph, poc 1.6  0.6 - 3.4   POC LYMPH PERCENT 34.1  10 - 50 %L   MID (cbc) 0.3  0 - 0.9   POC MID % 7.2  0 - 12 %M   POC Granulocyte 2.8  2 - 6.9   Granulocyte percent 58.7  37 - 80 %G   RBC 4.57  4.04 - 5.48 M/uL   Hemoglobin 13.2  12.2 - 16.2 g/dL   HCT, POC 64.440.2  03.437.7 - 47.9 %   MCV 88.0  80 - 97 fL   MCH, POC 28.9  27 - 31.2 pg   MCHC 32.8  31.8 - 35.4 g/dL   RDW, POC 74.214.4     Platelet Count, POC 316  142 - 424 K/uL   MPV 7.8  0 - 99.8 fL  POCT SEDIMENTATION RATE      Result Value Range   POCT SED RATE 14  0 - 22 mm/hr  POCT URINALYSIS DIPSTICK      Result Value Range   Color, UA orange     Clarity, UA clear     Glucose, UA neg  Bilirubin, UA neg     Ketones, UA eng     Spec Grav, UA 1.025     Blood, UA neg     pH, UA 6.5     Protein, UA neg     Urobilinogen, UA 0.2     Nitrite, UA neg     Leukocytes, UA Negative    POCT UA - MICROSCOPIC ONLY      Result Value Range   WBC, Ur, HPF, POC 0-2     RBC, urine, microscopic neg     Bacteria, U Microscopic trace     Mucus, UA mod     Epithelial cells, urine per micros 0-4     Crystals, Ur, HPF, POC neg     Casts, Ur, LPF, POC neg     Yeast, UA neg     Review of Systems  Constitutional: Negative for fever and chills.  HENT: Positive for congestion, rhinorrhea and sinus pressure. Negative for sore throat.   Respiratory: Positive for chest tightness.   Cardiovascular: Negative for chest pain.  Gastrointestinal: Negative for nausea, vomiting and abdominal pain.  Musculoskeletal: Negative for back pain.  Skin: Negative for color change.      Objective:   Physical Exam Physical Exam  Nursing note and vitals reviewed. Constitutional: Patient is oriented to person, place, and time. Patient appears well-developed and well-nourished. No distress.  HENT: Significant irritation around her nares.  Head: Normocephalic and atraumatic.  Neck: Neck supple. No  tracheal deviation present.  Cardiovascular: Normal rate, regular rhythm and normal heart sounds.   No murmur heard. Pulmonary/Chest: Effort normal and breath sounds normal. No respiratory distress. Patient has no wheezes. Patient has no rales.  Musculoskeletal: Normal range of motion.  Neurological: Patient is alert and oriented to person, place, and time.  Skin: Skin is warm and dry.  Psychiatric: Patient has a normal mood and affect. Patient's behavior is normal.   Triage Vitals:BP 122/70  Pulse 91  Temp(Src) 98.3 F (36.8 C) (Oral)  Resp 18  Ht 5' 6.5" (1.689 m)  Wt 212 lb (96.163 kg)  BMI 33.71 kg/m2  SpO2 97%  LMP 07/13/2011  DIAGNOSTIC STUDIES: Oxygen Saturation is 97% on room air, normal by my interpretation.    COORDINATION OF CARE: At 854 Discussed treatment plan with patient which includes flu swab. Patient agrees.  Results for orders placed in visit on 07/04/13  POCT INFLUENZA A/B      Result Value Range   Influenza A, POC Negative     Influenza B, POC Negative        Assessment & Plan:  Patient presents with what appears to be a sinusitis we'll treat with azelastine spray and amoxicillin.Marland Kitchen she was given Tessalon for cough. I personally performed the services described in this documentation, which was scribed in my presence. The recorded information has been reviewed and is accurate.

## 2013-12-08 ENCOUNTER — Ambulatory Visit (INDEPENDENT_AMBULATORY_CARE_PROVIDER_SITE_OTHER): Payer: BC Managed Care – PPO | Admitting: Family Medicine

## 2013-12-08 VITALS — BP 122/74 | HR 97 | Temp 98.3°F | Resp 16 | Ht 66.5 in | Wt 203.0 lb

## 2013-12-08 DIAGNOSIS — L989 Disorder of the skin and subcutaneous tissue, unspecified: Secondary | ICD-10-CM

## 2013-12-08 DIAGNOSIS — B029 Zoster without complications: Secondary | ICD-10-CM

## 2013-12-08 MED ORDER — DOXYCYCLINE HYCLATE 100 MG PO TABS: 100.0000 mg | ORAL_TABLET | Freq: Two times a day (BID) | ORAL | Status: DC

## 2013-12-08 MED ORDER — VALACYCLOVIR HCL 1 G PO TABS: 1000.0000 mg | ORAL_TABLET | Freq: Three times a day (TID) | ORAL | Status: DC

## 2013-12-08 NOTE — Patient Instructions (Signed)
We are going to treat you for a possible bacterial infection and shingles with doxycycline and valtrex.

## 2013-12-08 NOTE — Progress Notes (Signed)
[facility] [address] [place] [address] [phone]  Date:  12/08/2013   Name:  Megan Cook   DOB:  Dec 14, 1965   MRN:  [id number]  PCP:  Evette Georges, MD    Chief Complaint: Facial Swelling   History of Present Illness:  Megan Cook is a 48 y.o. very pleasant female patient who presents with the following:  She is here today with a possible infection on her face.  2 days ago she noted what felt like a small, early pimple on her left jaw.  The area got worse and spread to the area in front of her left ear.  She has not had any pus drainage.  NKI.  Her husband thought this might be shingles.  She is otherwise generally healthy, and does not have any systemic sx.  History of BTL  Patient Active Problem List   Diagnosis Date Noted  . Unspecified hypothyroidism 07/16/2011  . DEPRESSION 04/04/2008    Past Medical History  Diagnosis Date  . Thyroid disease   . Depression   . Heart murmur   . 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 Cesarean section    . Neck fusion    . Left knee    . Tonsillectomy      History  Substance Use Topics  . Smoking status: Never Smoker   . Smokeless tobacco: Not on file  . Alcohol Use: No    Family History  Problem Relation Age of Onset  . Stroke Mother   . Thyroid disease Mother   . Cancer Father   . Colon cancer Father   . Thyroid disease Sister   . Thyroid disease Sister     Allergies  Allergen Reactions  . Codeine Itching and Nausea Only    Medication list has been reviewed and updated.  Current Outpatient Prescriptions on File Prior to Visit  Medication Sig Dispense Refill  . furosemide (LASIX) 20 MG tablet TAKE 1 TABLET TWICE DAILY.  60 tablet  4  . levothyroxine (SYNTHROID, LEVOTHROID) 100 MCG tablet Take 100 mcg by mouth daily before breakfast.      . Multiple Vitamin (MULTIVITAMIN) tablet Take  1 tablet by mouth daily.      . WELLBUTRIN XL 150 MG 24 hr tablet Take one tablet a day  30 tablet  11   No current facility-administered medications on file prior to visit.    Review of Systems:  As per HPI- otherwise negative.   Physical Examination: Filed Vitals:   12/08/13 0824  BP: 122/74  Pulse: 97  Temp: 98.3 F (36.8 C)  Resp: 16   Filed Vitals:   12/08/13 0824  Height: 5' 6.5" (1.689 m)  Weight: 203 lb (92.08 kg)   Body mass index is 32.28 kg/(m^2). Ideal Body Weight: Weight in (lb) to have BMI = 25: 156.9  GEN: WDWN, NAD, Non-toxic, A & O x 3, overweight, looks well HEENT: Atraumatic, Normocephalic. Neck supple. No masses, No LAD.  Bilateral TM wnl, oropharynx normal.  PEERL,EOMI.   Ears and Nose: No external deformity. CV: RRR, No M/G/R. No JVD. No thrill. No extra heart sounds. PULM: CTA B, no wheezes, crackles, rhonchi. No retractions. No resp. distress. No accessory muscle use. EXTR: No c/c/e NEURO Normal gait.  PSYCH: Normally interactive. Conversant. Not depressed or anxious appearing.  Calm demeanor.  Likely early shingles  rash with lesion on the left chin and in front of her left ear, and small associated nodes.  There is a tender, indurated area on her chin which is not fluctuant.    Assessment and Plan: Lesion of skin of face - Plan: valACYclovir (VALTREX) 1000 MG tablet, doxycycline (VIBRA-TABS) 100 MG tablet  Shingles outbreak  Treat outbreak on face with valtrex and doxycycline- unsure is shingles or cellulitis but more strongly suspect shingles.  She will let us know if not better in the next couple of days- Sooner if worse.    avoid groups vulnerable to varicella  Signed Abbe AmsterdamJessica Copland, MD

## 2013-12-11 ENCOUNTER — Ambulatory Visit (INDEPENDENT_AMBULATORY_CARE_PROVIDER_SITE_OTHER): Payer: BC Managed Care – PPO | Admitting: Family Medicine

## 2013-12-11 VITALS — BP 112/72 | HR 84 | Temp 98.6°F | Resp 18 | Ht 67.0 in | Wt 203.0 lb

## 2013-12-11 DIAGNOSIS — L0390 Cellulitis, unspecified: Secondary | ICD-10-CM

## 2013-12-11 DIAGNOSIS — L0201 Cutaneous abscess of face: Secondary | ICD-10-CM

## 2013-12-11 DIAGNOSIS — L03211 Cellulitis of face: Secondary | ICD-10-CM

## 2013-12-11 DIAGNOSIS — L0291 Cutaneous abscess, unspecified: Secondary | ICD-10-CM

## 2013-12-11 DIAGNOSIS — R51 Headache: Secondary | ICD-10-CM

## 2013-12-11 DIAGNOSIS — R519 Headache, unspecified: Secondary | ICD-10-CM

## 2013-12-11 MED ORDER — CEFTRIAXONE SODIUM 1 G IJ SOLR
1.0000 g | Freq: Once | INTRAMUSCULAR | Status: AC
  Administered 2013-12-11: 1 g via INTRAMUSCULAR

## 2013-12-11 MED ORDER — HYDROCODONE-ACETAMINOPHEN 5-325 MG PO TABS: 1.0000 | ORAL_TABLET | Freq: Four times a day (QID) | ORAL | Status: DC | PRN

## 2013-12-11 NOTE — Patient Instructions (Signed)
Apply warm compresses  Return tomorrow [facility]  Continue antibiotic doxycycline and the Valtrex  Hydrocodone 5/325 one everything.

## 2013-12-11 NOTE — Progress Notes (Signed)
Subjective: Patient is here to recheck that red painful area on her face. She was treated for both the possibility of shingles and abscess. She is on doxycycline Valtrex. The pain has gotten worse and area more swollen. She has a red area on her left jaw off but the pain goes up toward her a year and down under the jaw.  Objective: She is red and indurated about 3 cm in diameter. The induration is not a single round nodule of abscess but has irregularity of the palpable in the tight tissue.   Procedure: I&D abscess This was numbed up with 1% lidocaine with epinephrine. She has a history of Carbocaine allergy but has been known to tolerate lidocaine well. An 18-gauge needle was inserted a small amount of yellow pus obtained. A small mark to be expressed through the needle hole. Using an 11 blade a puncture wound was made into the tissue, only a couple of millimeters long. Yellow pus could be squeezed out. It seemed to be loculated and would come out from separate little pockets. The blade was inserted and a couple of different angles to facilitate further drainage. Culture was obtained. Some of the pressure was late relieved although they're still tissue induration.  Assessment: Facial abscess, doubt shingles at this point I&D abscess  Plan: Return tomorrow to be checked by Rhoderick MoodyHeather Marte PAC Ceftriaxone 1 g today. Continue the doxycycline. We will continue the Valtrex since she had so much pain shooting with this. I doubt it is shingles that got infected but at this point it won't hurt her.  Norco 5/325 prescribed, but patient is in recovery and her husband will manage the medication. Gave her 10 pills only.

## 2013-12-12 ENCOUNTER — Ambulatory Visit (INDEPENDENT_AMBULATORY_CARE_PROVIDER_SITE_OTHER): Payer: BC Managed Care – PPO | Admitting: Physician Assistant

## 2013-12-12 VITALS — BP 118/66 | HR 75 | Temp 98.6°F | Resp 18 | Ht 67.0 in | Wt 203.0 lb

## 2013-12-12 DIAGNOSIS — L0201 Cutaneous abscess of face: Secondary | ICD-10-CM

## 2013-12-12 DIAGNOSIS — L03211 Cellulitis of face: Secondary | ICD-10-CM

## 2013-12-12 MED ORDER — CEFTRIAXONE SODIUM 1 G IJ SOLR
1.0000 g | Freq: Once | INTRAMUSCULAR | Status: AC
  Administered 2013-12-12: 1 g via INTRAMUSCULAR

## 2013-12-12 NOTE — Progress Notes (Signed)
   Subjective:    Patient ID: Megan Cook, female    DOB: Jul 13, 1965, 48 y.o.   MRN: [id number]  HPI 48 year old female presents for recheck of left sided facial abscess on the left side of her jaw.  Initially seen on 7/15 and placed on Valtrex and Doxycycline for possible shingles vs early abscess. Rechecked yesterday and determined likely abscess as cause. Area was aspirated and purulence obtained. Patient reports it continues to drain and does feel somewhat better. She was given a small amount of hydrocodone to take which did allow her to sleep last night. She is in recovery after abuse of cough syrups. Reports she "does ok" if her husband is in control of her prescription. [place] and sponsor [place] aware.     Review of Systems  Constitutional: Negative for fever and chills.  Gastrointestinal: Negative for nausea and vomiting.  Skin: Positive for color change and wound.       Objective:   Physical Exam  Constitutional: She is oriented to person, place, and time. She appears well-developed and well-nourished.  HENT:  Head: Normocephalic and atraumatic.  Right Ear: External ear normal.  Left Ear: External ear normal.  Eyes: Conjunctivae are normal.  Neck: Normal range of motion.  Cardiovascular: Normal rate.   Pulmonary/Chest: Effort normal.  Neurological: She is alert and oriented to person, place, and time.  Skin:     Noted are has erythematous, indurated lesion with central opening and drainage noted. +induration. No fluctuance. +TTP  Psychiatric: She has a normal mood and affect. Her behavior is normal. Judgment and thought content normal.      Culture pending    Assessment & Plan:  Facial abscess  Abscess seems to be improving, although it is still quite indurated and tender. It is draining spontaneously so I recommend she continue warm compresses 2-3 times daily. Norco 5/325 mg qhs prn severe pain. Will repeat Rocephin 1 gram IM today.  Continue doxycycline as  directed. Recheck with Dr. Alwyn RenHopper tomorrow.

## 2013-12-13 ENCOUNTER — Ambulatory Visit (INDEPENDENT_AMBULATORY_CARE_PROVIDER_SITE_OTHER): Payer: BC Managed Care – PPO | Admitting: Family Medicine

## 2013-12-13 VITALS — BP 110/64 | HR 64 | Temp 99.0°F | Resp 12 | Ht 66.5 in | Wt 208.2 lb

## 2013-12-13 DIAGNOSIS — L03211 Cellulitis of face: Secondary | ICD-10-CM

## 2013-12-13 DIAGNOSIS — B958 Unspecified staphylococcus as the cause of diseases classified elsewhere: Secondary | ICD-10-CM

## 2013-12-13 DIAGNOSIS — L0201 Cutaneous abscess of face: Secondary | ICD-10-CM

## 2013-12-13 LAB — WOUND CULTURE
Gram Stain: NONE SEEN
Gram Stain: NONE SEEN

## 2013-12-13 NOTE — Progress Notes (Signed)
Subjective: Patient is here for recheck of the abscess on her chin. It is improving.  Objective: No redness is noted. She has some slightly flaky skin with a little crust where the I&D was performed. There is still a bout a 1.5 cm nodule of induration deep to this. Much less tender.  Culture did grow staph aureus, sensitive to tetracycline  Assessment: Staph abscess of the chin  Plan: Continue antibiotics Discontinue the antiviral medication Return as needed

## 2013-12-13 NOTE — Patient Instructions (Signed)
Return if in all worse

## 2014-02-12 ENCOUNTER — Other Ambulatory Visit: Payer: Self-pay | Admitting: Family Medicine

## 2014-03-21 ENCOUNTER — Other Ambulatory Visit: Payer: Self-pay

## 2014-03-21 DIAGNOSIS — Z1231 Encounter for screening mammogram for malignant neoplasm of breast: Secondary | ICD-10-CM

## 2014-04-07 ENCOUNTER — Ambulatory Visit
Admission: RE | Admit: 2014-04-07 | Discharge: 2014-04-07 | Disposition: A | Discharge status: Home / Self Care | Payer: BC Managed Care – PPO | Source: Ambulatory Visit

## 2014-04-07 DIAGNOSIS — Z1231 Encounter for screening mammogram for malignant neoplasm of breast: Secondary | ICD-10-CM

## 2014-05-15 ENCOUNTER — Other Ambulatory Visit: Payer: Self-pay | Admitting: Emergency Medicine

## 2014-06-16 ENCOUNTER — Other Ambulatory Visit: Payer: Self-pay | Admitting: Emergency Medicine

## 2014-07-07 ENCOUNTER — Ambulatory Visit (INDEPENDENT_AMBULATORY_CARE_PROVIDER_SITE_OTHER): Payer: BLUE CROSS/BLUE SHIELD | Admitting: Family Medicine

## 2014-07-07 ENCOUNTER — Encounter: Payer: Self-pay | Admitting: Family Medicine

## 2014-07-07 VITALS — BP 126/70 | HR 75 | Temp 98.5°F | Resp 12 | Ht 66.5 in | Wt 221.0 lb

## 2014-07-07 DIAGNOSIS — E8770 Fluid overload, unspecified: Secondary | ICD-10-CM

## 2014-07-07 DIAGNOSIS — R05 Cough: Secondary | ICD-10-CM

## 2014-07-07 DIAGNOSIS — R609 Edema, unspecified: Secondary | ICD-10-CM

## 2014-07-07 DIAGNOSIS — Z5181 Encounter for therapeutic drug level monitoring: Secondary | ICD-10-CM

## 2014-07-07 DIAGNOSIS — R059 Cough, unspecified: Secondary | ICD-10-CM

## 2014-07-07 DIAGNOSIS — F329 Major depressive disorder, single episode, unspecified: Secondary | ICD-10-CM

## 2014-07-07 DIAGNOSIS — F32A Depression, unspecified: Secondary | ICD-10-CM

## 2014-07-07 DIAGNOSIS — J208 Acute bronchitis due to other specified organisms: Secondary | ICD-10-CM

## 2014-07-07 LAB — COMPREHENSIVE METABOLIC PANEL
ALBUMIN: 4 g/dL (ref 3.5–5.2)
ALT: 20 U/L (ref 0–35)
AST: 19 U/L (ref 0–37)
Alkaline Phosphatase: 100 U/L (ref 39–117)
BUN: 13 mg/dL (ref 6–23)
CALCIUM: 9 mg/dL (ref 8.4–10.5)
CO2: 25 mEq/L (ref 19–32)
Chloride: 107 mEq/L (ref 96–112)
Creat: 0.85 mg/dL (ref 0.50–1.10)
GLUCOSE: 87 mg/dL (ref 70–99)
POTASSIUM: 4.4 meq/L (ref 3.5–5.3)
Sodium: 141 mEq/L (ref 135–145)
TOTAL PROTEIN: 6.4 g/dL (ref 6.0–8.3)
Total Bilirubin: 0.3 mg/dL (ref 0.2–1.2)

## 2014-07-07 MED ORDER — WELLBUTRIN XL 150 MG PO TB24: ORAL_TABLET | ORAL | Status: DC

## 2014-07-07 MED ORDER — BENZONATATE 100 MG PO CAPS: 100.0000 mg | ORAL_CAPSULE | Freq: Three times a day (TID) | ORAL | Status: DC | PRN

## 2014-07-07 MED ORDER — CEFDINIR 300 MG PO CAPS: 300.0000 mg | ORAL_CAPSULE | Freq: Two times a day (BID) | ORAL | Status: DC

## 2014-07-07 MED ORDER — FUROSEMIDE 20 MG PO TABS: ORAL_TABLET | ORAL | Status: DC

## 2014-07-07 NOTE — Progress Notes (Signed)
[facility] [address], [place] [id number] [phone]  Date:  07/07/2014   Name:  Megan Hawthornerri S Pollina   DOB:  12/21/1965   MRN:  [id number]  PCP:  Evette GeorgesDD,JEFFREY ALLEN, MD    Chief Complaint: Sinus Problem; Otalgia; Headache; Cough; and Medication Refill   History of Present Illness:  Megan Cook is a 49 y.o. very pleasant female patient who presents with the following:  Here today with illness. She had a cold for about one week, but then noted onset of facial and sinus pain.she also has a productive cough and hoarseness. First had onset of her sx about one week ago- started with a ST.  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   She has been feeling really tired.  She has not had a fever.   She is getting some discolored mucus from her sinuses and chest.   She is generally in good health except for a thyroid problem- [facility]   She is on wellbutrin 150 24 hour. She needs a RF of this.  She has been taking this for several years- she feels that it is working well for her and would like to continue to take it.  She also uses lasix 20 mg up to BID; she uses it just as needed for fluid retention.  Does not take that often  Patient Active Problem List   Diagnosis Date Noted  . Unspecified hypothyroidism 07/16/2011  . DEPRESSION 04/04/2008    Past Medical History  Diagnosis Date  . Thyroid disease   . Depression   . Heart murmur   . 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 Cesarean section    . Neck fusion    . Left knee    . Tonsillectomy    . Tubal ligation      History  Substance Use Topics  . Smoking status: Never Smoker   . Smokeless tobacco: Never Used  . Alcohol Use: No    Family History  Problem Relation Age of Onset  . Stroke Mother    . Thyroid disease Mother   . Cancer Father   . Colon cancer Father   . Thyroid disease Sister   . Thyroid disease Sister     Allergies  Allergen Reactions  . Carbocaine [Mepivacaine Hcl]     Muscle pain and swelling.  . Codeine Itching and Nausea Only    Medication list has been reviewed and updated.  Current Outpatient Prescriptions on File Prior to Visit  Medication Sig Dispense Refill  . estradiol (VIVELLE-DOT) 0.025 MG/24HR Place 1 patch onto the skin 2 (two) times a week.    . furosemide (LASIX) 20 MG tablet Take 1 tablet twice daily. NEED OFFICE VISIT FOR FURTHER REFILLS 60 tablet 0  . levothyroxine (SYNTHROID, LEVOTHROID) 100 MCG tablet Take 0.125 mcg by mouth daily before breakfast.     . Multiple Vitamin (MULTIVITAMIN) tablet Take 1 tablet by mouth daily.    . Progesterone 100 MG CAPS Take by mouth 3 (three) times a week.     . WELLBUTRIN XL 150 MG 24 hr tablet TAKE 1 TABLET ONCE DAILY  "NEEDS OFFICE VISIT FOR ADDITIONAL REFILLS" 30 tablet 0  . doxycycline (VIBRA-TABS) 100 MG tablet Take 1  tablet (100 mg total) by mouth 2 (two) times daily. 20 tablet 0   No current facility-administered medications on file prior to visit.    Review of Systems:  As per HPI- otherwise negative.   Physical Examination: Filed Vitals:   07/07/14 0819  BP: 126/70  Pulse: 75  Temp: 98.5 F (36.9 C)  Resp: 12   Filed Vitals:   07/07/14 0819  Height: 5' 6.5" (1.689 m)  Weight: 221 lb (100.245 kg)   Body mass index is 35.14 kg/(m^2). Ideal Body Weight: Weight in (lb) to have BMI = 25: 156.9  GEN: WDWN, NAD, Non-toxic, A & O x 3, overweight, looks well HEENT: Atraumatic, Normocephalic. Neck supple. No masses, No LAD. Bilateral TM wnl, oropharynx normal.  PEERL,EOMI.   Ears and Nose: No external deformity. CV: RRR, No M/G/R. No JVD. No thrill. No extra heart sounds. PULM: CTA B, no wheezes. No retractions. No resp. distress. No accessory muscle use.  Fluid congestion in  RLL EXTR: No c/c/e NEURO Normal gait.  PSYCH: Normally interactive. Conversant. Not depressed or anxious appearing.  Calm demeanor.    Assessment and Plan: Acute bronchitis due to other specified organisms - Plan: cefdinir (OMNICEF) 300 MG capsule  Medication monitoring encounter - Plan: Comprehensive metabolic panel  Cough - Plan: benzonatate (TESSALON) 100 MG capsule  Fluid retention - Plan: Comprehensive metabolic panel, furosemide (LASIX) 20 MG tablet  Depression - Plan: WELLBUTRIN XL 150 MG 24 hr tablet  Treat for bronchitis with omnicef, tessalon as needed for cough Check CMP as she is on lasix prn Refill wellbutrin  See patient instructions for more details.     Signed Abbe Amsterdam, MD

## 2014-07-07 NOTE — Patient Instructions (Signed)
Use the omnicef twice a day for bronchitis/ sinusitis.  Let me know if you are not better soon- Sooner if worse.   Use the tessalon as needed for cough I will be in touch with your labs- we will check your kidney function and electrolytes since you are using lasix as needed Continue your wellbutrin

## 2014-07-13 ENCOUNTER — Ambulatory Visit (INDEPENDENT_AMBULATORY_CARE_PROVIDER_SITE_OTHER): Payer: BLUE CROSS/BLUE SHIELD | Admitting: Family Medicine

## 2014-07-13 VITALS — BP 134/80 | HR 72 | Temp 97.9°F | Resp 20 | Ht 67.0 in | Wt 221.0 lb

## 2014-07-13 DIAGNOSIS — H6502 Acute serous otitis media, left ear: Secondary | ICD-10-CM

## 2014-07-13 DIAGNOSIS — H9202 Otalgia, left ear: Secondary | ICD-10-CM

## 2014-07-13 NOTE — Patient Instructions (Signed)
Continue omnicef, advil as needed for soreness. You can try a few days of flonase to see if this helps. Return to the clinic or go to the nearest emergency room if any of your symptoms worsen or new symptoms occur. Serous Otitis Media Serous otitis media is fluid in the middle ear space. This space contains the bones for hearing and air. Air in the middle ear space helps to transmit sound.  The air gets there through the eustachian tube. This tube goes from the back of the nose (nasopharynx) to the middle ear space. It keeps the pressure in the middle ear the same as the outside world. It also helps to drain fluid from the middle ear space. CAUSES  Serous otitis media occurs when the eustachian tube gets blocked. Blockage can come from:  Ear infections.  Colds and other upper respiratory infections.  Allergies.  Irritants such as cigarette smoke.  Sudden changes in air pressure (such as descending in an airplane).  Enlarged adenoids.  A mass in the nasopharynx. During colds and upper respiratory infections, the middle ear space can become temporarily filled with fluid. This can happen after an ear infection also. Once the infection clears, the fluid will generally drain out of the ear through the eustachian tube. If it does not, then serous otitis media occurs. SIGNS AND SYMPTOMS   Hearing loss.  A feeling of fullness in the ear, without pain.  Young children may not show any symptoms but may show slight behavioral changes, such as agitation, ear pulling, or crying. DIAGNOSIS  Serous otitis media is diagnosed by an ear exam. Tests may be done to check on the movement of the eardrum. Hearing exams may also be done. TREATMENT  The fluid most often goes away without treatment. If allergy is the cause, allergy treatment may be helpful. Fluid that persists for several months may require minor surgery. A small tube is placed in the eardrum to:  Drain the fluid.  Restore the air in the  middle ear space. In certain situations, antibiotic medicines are used to avoid surgery. Surgery may be done to remove enlarged adenoids (if this is the cause). HOME CARE INSTRUCTIONS   Keep children away from tobacco smoke.  Keep all follow-up visits as directed by your health care provider. SEEK MEDICAL CARE IF:   Your hearing is not better in 3 months.  Your hearing is worse.  You have ear pain.  You have drainage from the ear.  You have dizziness.  You have serous otitis media only in one ear or have any bleeding from your nose (epistaxis).  You notice a lump on your neck. MAKE SURE YOU:  Understand these instructions.   Will watch your condition.   Will get help right away if you are not doing well or get worse.  Document Released: 08/03/2003 Document Revised: 09/27/2013 Document Reviewed: 12/08/2012 [facility] Patient Information 2015 [place]. This information is not intended to replace advice given to you by your health care provider. Make sure you discuss any questions you have with your health care provider.

## 2014-07-13 NOTE — Progress Notes (Signed)
Subjective:  This chart was scribed for Meredith Staggers MD,  by Veverly Fells, at [facility].  This patient was seen in room 9 and the patient's care was started at 7:09 PM.    Patient ID: Megan Cook, female    DOB: 08-Oct-1965, 49 y.o.   MRN: [id number]  HPI  HPI Comments: Megan Cook is a 49 y.o. female who presents to [facility] for left ear otalgia onset a week  ago. She has associated symptoms of a headache on the left side of her head.  Patient notes she is still taking omnicef and her cough and congestion has gotten a better.  Denies any fever/chills or discharge. Patient has not taken any nasal sprays allergy sprays.   She was seen 6 days ago by Dr. Dallas Schimke for cough, bronchitis and other concerns.  Was treated with Tessalon and Omnicef.    Patient Active Problem List   Diagnosis Date Noted  . Unspecified hypothyroidism 07/16/2011  . DEPRESSION 04/04/2008   Past Medical History  Diagnosis Date  . Thyroid disease   . Depression   . Heart murmur   . 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 Cesarean section    . Neck fusion    . Left knee    . Tonsillectomy    . Tubal ligation     Allergies  Allergen Reactions  . Carbocaine [Mepivacaine Hcl]     Muscle pain and swelling.  . Codeine Itching and Nausea Only   Prior to Admission medications   Medication Sig Start Date End Date Taking? Authorizing Provider  benzonatate (TESSALON) 100 MG capsule Take 1 capsule (100 mg total) by mouth 3 (three) times daily as needed for cough. 07/07/14  Yes Gwenlyn Found Copland, MD  cefdinir (OMNICEF) 300 MG capsule Take 1 capsule (300 mg total) by mouth 2 (two) times daily. 07/07/14  Yes Gwenlyn Found Copland, MD  estradiol (VIVELLE-DOT) 0.025 MG/24HR Place 1 patch onto the skin 2 (two) times a week.   Yes Historical Provider, MD  furosemide (LASIX) 20 MG tablet  Take 1 tablet twice daily. 07/07/14  Yes Gwenlyn Found Copland, MD  levothyroxine (SYNTHROID, LEVOTHROID) 100 MCG tablet Take 0.125 mcg by mouth daily before breakfast.    Yes Historical Provider, MD  Multiple Vitamin (MULTIVITAMIN) tablet Take 1 tablet by mouth daily.   Yes Historical Provider, MD  Progesterone 100 MG CAPS Take by mouth 3 (three) times a week.    Yes Historical Provider, MD  WELLBUTRIN XL 150 MG 24 hr tablet TAKE 1 TABLET ONCE DAILY 07/07/14  Yes Pearline Cables, MD   History   Social History  . Marital Status: Married    Spouse Name: N/A  . Number of Children: N/A  . Years of Education: N/A   Occupational History  . Not on file.   Social History Main Topics  . Smoking status: Never Smoker   . Smokeless tobacco: Never Used  . Alcohol Use: No  . Drug Use: No  . Sexual Activity: Yes    Birth Control/ Protection: Surgical   Other Topics Concern  . Not on file   Social History Narrative         Review of Systems  Constitutional: Negative for fever and chills.  HENT: Positive for congestion and ear pain.   Respiratory: Positive for cough.   Cardiovascular:  Negative for chest pain.  Gastrointestinal: Negative for nausea and vomiting.  Neurological: Positive for headaches.       Objective:   Physical Exam  Constitutional: She is oriented to person, place, and time. She appears well-developed and well-nourished. No distress.  HENT:  Head: Normocephalic and atraumatic.  Right Ear: Hearing, tympanic membrane, external ear and ear canal normal.  Left Ear: Hearing, tympanic membrane, external ear and ear canal normal.  Nose: Nose normal.  Mouth/Throat: Oropharynx is clear and moist. No oropharyngeal exudate.  canals are clear theres is no discharge. No rash.    Left TM: She has some clear fluid behind the left TM but no sig erythema retraction of rupture.   Right TM: Minimal fluid behind right TM , no erythema.   No scalp rash on head.   Swollen  turbinates.   Eyes: Conjunctivae and EOM are normal. Pupils are equal, round, and reactive to light.  Neck:  No lymphadenopathy in the neck.   Cardiovascular: Normal rate, regular rhythm, normal heart sounds and intact distal pulses.   No murmur heard. Pulmonary/Chest: Effort normal and breath sounds normal. No respiratory distress. She has no wheezes. She has no rhonchi.  Neurological: She is alert and oriented to person, place, and time.  Skin: Skin is warm and dry. No rash noted.  Psychiatric: She has a normal mood and affect. Her behavior is normal.  Vitals reviewed.       Filed Vitals:   07/13/14 1841  BP: 134/80  Pulse: 72  Temp: 97.9 F (36.6 C)  TempSrc: Oral  Resp: 20  Height: 5\' 7"  (1.702 m)  Weight: 221 lb (100.245 kg)  SpO2: 100%      Assessment & Plan:  Megan Hawthornerri S Gerads is a 49 y.o. female Acute serous otitis media of left ear, recurrence not specified  Otalgia of left ear  Already on omnicef, suspected pressure sensation from serous otitis/ETD. Trial of flonase ns, other sx care as below. advil if needed for pain. rtc precautions.    No orders of the defined types were placed in this encounter.   Patient Instructions  Continue omnicef, advil as needed for soreness. You can try a few days of flonase to see if this helps. Return to the clinic or go to the nearest emergency room if any of your symptoms worsen or new symptoms occur. Serous Otitis Media Serous otitis media is fluid in the middle ear space. This space contains the bones for hearing and air. Air in the middle ear space helps to transmit sound.  The air gets there through the eustachian tube. This tube goes from the back of the nose (nasopharynx) to the middle ear space. It keeps the pressure in the middle ear the same as the outside world. It also helps to drain fluid from the middle ear space. CAUSES  Serous otitis media occurs when the eustachian tube gets blocked. Blockage can come from:  Ear  infections.  Colds and other upper respiratory infections.  Allergies.  Irritants such as cigarette smoke.  Sudden changes in air pressure (such as descending in an airplane).  Enlarged adenoids.  A mass in the nasopharynx. During colds and upper respiratory infections, the middle ear space can become temporarily filled with fluid. This can happen after an ear infection also. Once the infection clears, the fluid will generally drain out of the ear through the eustachian tube. If it does not, then serous otitis media occurs. SIGNS AND SYMPTOMS   Hearing loss.  A feeling of fullness in the ear, without pain.  Young children may not show any symptoms but may show slight behavioral changes, such as agitation, ear pulling, or crying. DIAGNOSIS  Serous otitis media is diagnosed by an ear exam. Tests may be done to check on the movement of the eardrum. Hearing exams may also be done. TREATMENT  The fluid most often goes away without treatment. If allergy is the cause, allergy treatment may be helpful. Fluid that persists for several months may require minor surgery. A small tube is placed in the eardrum to:  Drain the fluid.  Restore the air in the middle ear space. In certain situations, antibiotic medicines are used to avoid surgery. Surgery may be done to remove enlarged adenoids (if this is the cause). HOME CARE INSTRUCTIONS   Keep children away from tobacco smoke.  Keep all follow-up visits as directed by your health care provider. SEEK MEDICAL CARE IF:   Your hearing is not better in 3 months.  Your hearing is worse.  You have ear pain.  You have drainage from the ear.  You have dizziness.  You have serous otitis media only in one ear or have any bleeding from your nose (epistaxis).  You notice a lump on your neck. MAKE SURE YOU:  Understand these instructions.   Will watch your condition.   Will get help right away if you are not doing well or get worse.   Document Released: 08/03/2003 Document Revised: 09/27/2013 Document Reviewed: 12/08/2012 [facility] Patient Information 2015 [place], [place]. This information is not intended to replace advice given to you by your health care provider. Make sure you discuss any questions you have with your health care provider.     I personally performed the services described in this documentation, which was scribed in my presence. The recorded information has been reviewed and considered, and addended by me as needed.

## 2014-07-15 ENCOUNTER — Ambulatory Visit: Payer: Self-pay | Admitting: Podiatrist

## 2015-01-27 ENCOUNTER — Telehealth: Payer: Self-pay

## 2015-01-27 NOTE — Telephone Encounter (Signed)
Pt called to let us know that effective 02/25/15 [employer] will be requiring additional information as to why the she is taking Welbutrin. She said [employer] may have mailed [place] something about this as well.

## 2015-02-07 NOTE — Telephone Encounter (Signed)
Spoke with patient, she states she received a letter yesterday to disregard authorization needed.

## 2015-02-07 NOTE — Telephone Encounter (Signed)
Started PA in covermymeds saved

## 2015-02-07 NOTE — Telephone Encounter (Signed)
Pt was changed to brand name Wellbutrin d/t generic not being as effective and thought to have SE of swelling of lower extremities at 11/17/12 OV.

## 2015-02-07 NOTE — Telephone Encounter (Signed)
Has she tried any other medications beside the wellbutrin ?

## 2015-04-05 ENCOUNTER — Ambulatory Visit (INDEPENDENT_AMBULATORY_CARE_PROVIDER_SITE_OTHER): Payer: BLUE CROSS/BLUE SHIELD | Admitting: Emergency Medicine

## 2015-04-05 VITALS — BP 120/80 | HR 82 | Temp 97.9°F | Resp 16 | Ht 66.25 in | Wt 219.0 lb

## 2015-04-05 DIAGNOSIS — I890 Lymphedema, not elsewhere classified: Secondary | ICD-10-CM | POA: Diagnosis not present

## 2015-04-05 NOTE — Progress Notes (Signed)
Patient ID: Megan Cook, female   DOB: 1965-11-06, 49 y.o.   MRN: [id number]    By signing my name below, I, Essence Howell, attest that this documentation has been prepared under the direction and in the presence of Collene GobbleSteven A Ulys Favia, MD Electronically Signed: Charline BillsEssence Howell, ED Scribe 04/05/2015 at 9:36 AM.  Chief Complaint:  Chief Complaint  Patient presents with  . Surgery Consult   HPI: Megan Cook is a 49 y.o. female, with a h/o thyroid disease, who [facility] consult. Pt reports h/o swelling and increased, aching pain in her upper and lower extremities for the past 3 years. She reports that pain is worsened with palpation, exercising and sitting for extended periods of time. She [facility] and was diagnosed with lipedema. Pt denies pain or swelling in her hands or feet. She has been on less than 20 carbohydrates a day diet. Pt previously had lymphatic [facility] and now sees [employer] x1 week for 1.5 hours. She has upcoming water assisted lipo on January 12 [place].   Past Medical History  Diagnosis Date  . Thyroid disease   . Depression   . Heart murmur   . 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 Cesarean section    . Neck fusion    . Left knee    . Tonsillectomy    . Tubal ligation     Social History   Social History  . Marital Status: Married    Spouse Name: N/A  . Number of Children: N/A  . Years of Education: N/A   Social History Main Topics  . Smoking status: Never Smoker   . Smokeless tobacco: Never Used  . Alcohol Use: No  . Drug Use: No  . Sexual Activity: Yes    Birth Control/ Protection: Surgical   Other Topics Concern  . None   Social History Narrative   Family History  Problem Relation Age of Onset  . Stroke Mother   . Thyroid disease Mother   . Cancer Father   . Colon cancer Father   .  Thyroid disease Sister   . Thyroid disease Sister    Allergies  Allergen Reactions  . Carbocaine [Mepivacaine Hcl]     Muscle pain and swelling.  . Codeine Itching and Nausea Only   Prior to Admission medications   Medication Sig Start Date End Date Taking? Authorizing Provider  Multiple Vitamin (MULTIVITAMIN) tablet Take 1 tablet by mouth daily.   Yes Historical Provider, MD  Thyroid (NATURE-THROID) 81.25 MG TABS Take by mouth.   Yes Historical Provider, MD  WELLBUTRIN XL 150 MG 24 hr tablet TAKE 1 TABLET ONCE DAILY 07/07/14  Yes Jessica C Copland, MD  estradiol (VIVELLE-DOT) 0.025 MG/24HR Place 1 patch onto the skin 2 (two) times a week.    Historical Provider, MD  furosemide (LASIX) 20 MG tablet Take 1 tablet twice daily. Patient not taking: Reported on 04/05/2015 07/07/14   Pearline CablesJessica C Copland, MD  levothyroxine (SYNTHROID, LEVOTHROID) 100 MCG tablet Take 0.125 mcg by mouth daily before breakfast.     Historical Provider, MD  Progesterone 100 MG CAPS Take by mouth 3 (three) times a week.     Historical Provider, MD    ROS: The patient denies fevers, chills, night sweats, unintentional weight loss, chest pain, palpitations, +leg swelling, +myalgias, wheezing, dyspnea  on exertion, nausea, vomiting, abdominal pain, dysuria, hematuria, melena, numbness, weakness, or tingling.   All other systems have been reviewed and were otherwise negative with the exception of those mentioned in the HPI and as above.    PHYSICAL EXAM: Filed Vitals:   04/05/15 0815  BP: 120/80  Pulse: 82  Temp: 97.9 F (36.6 C)  Resp: 16   Body mass index is 35.07 kg/(m^2).   General: Alert, no acute distress HEENT:  Normocephalic, atraumatic, oropharynx patent. Eye: Nonie Hoyer [facility] Cardiovascular:  Regular rate and rhythm, no rubs murmurs or gallops.  No Carotid bruits, radial pulse intact. No pedal edema.  Respiratory: Clear to auscultation bilaterally.  No wheezes, rales, or rhonchi.  No cyanosis, no use of  accessory musculature Abdominal: No organomegaly, abdomen is soft and non-tender, positive bowel sounds.  No masses. Musculoskeletal: Gait intact. Appears to be lymphedema which involves UE and LE but spares the hands and feet.  Skin: No rashes. Neurologic: Facial musculature symmetric. Psychiatric: Patient acts appropriately throughout our interaction. Lymphatic: No cervical or submandibular lymphadenopathy  LABS:  EKG/XRAY:   Primary read interpreted by Dr. Cleta Alberts at [facility].   ASSESSMENT/PLAN: I told patient I would be happy to write a letter of recommendation for her to do proceed with liposuction in [place]. I asked that she have her 2 physicians at [facility] send me their reports so I could make this a part of my letter.   Gross sideeffects, risk and benefits, and alternatives of medications d/w patient. Patient is aware that all medications have potential sideeffects and we are unable to predict every sideeffect or drug-drug interaction that may occur.  Lesle Chris MD 04/05/2015 9:04 AM

## 2015-04-13 ENCOUNTER — Encounter: Payer: Self-pay | Admitting: Emergency Medicine

## 2015-04-13 ENCOUNTER — Telehealth: Payer: Self-pay | Admitting: Family Medicine

## 2015-04-13 NOTE — Telephone Encounter (Signed)
LMOM to pick up letter at 102 from Dr. Cleta Albertsaub

## 2015-05-10 ENCOUNTER — Ambulatory Visit (INDEPENDENT_AMBULATORY_CARE_PROVIDER_SITE_OTHER): Payer: BLUE CROSS/BLUE SHIELD | Admitting: Emergency Medicine

## 2015-05-10 VITALS — BP 128/78 | HR 79 | Temp 98.5°F | Resp 16 | Ht 67.0 in | Wt 221.8 lb

## 2015-05-10 DIAGNOSIS — F329 Major depressive disorder, single episode, unspecified: Secondary | ICD-10-CM

## 2015-05-10 DIAGNOSIS — R9431 Abnormal electrocardiogram [ECG] [EKG]: Secondary | ICD-10-CM

## 2015-05-10 DIAGNOSIS — M7989 Other specified soft tissue disorders: Secondary | ICD-10-CM

## 2015-05-10 DIAGNOSIS — F32A Depression, unspecified: Secondary | ICD-10-CM

## 2015-05-10 DIAGNOSIS — Z419 Encounter for procedure for purposes other than remedying health state, unspecified: Secondary | ICD-10-CM

## 2015-05-10 LAB — PROTIME-INR
INR: 1.02 (ref <1.50)
PROTHROMBIN TIME: 13.5 s (ref 11.6–15.2)

## 2015-05-10 LAB — COMPLETE METABOLIC PANEL WITH GFR
ALT: 19 U/L (ref 6–29)
AST: 19 U/L (ref 10–35)
Albumin: 4.5 g/dL (ref 3.6–5.1)
Alkaline Phosphatase: 102 U/L (ref 33–130)
BUN: 28 mg/dL — AB (ref 7–25)
CHLORIDE: 104 mmol/L (ref 98–110)
CO2: 24 mmol/L (ref 20–31)
CREATININE: 0.84 mg/dL (ref 0.50–1.05)
Calcium: 9.7 mg/dL (ref 8.6–10.4)
GFR, Est African American: >89 mL/min (ref >=60)
GFR, Est Non African American: 81 mL/min (ref >=60)
GLUCOSE: 93 mg/dL (ref 65–99)
Potassium: 4.2 mmol/L (ref 3.5–5.3)
Sodium: 138 mmol/L (ref 135–146)
Total Bilirubin: 0.3 mg/dL (ref 0.2–1.2)
Total Protein: 6.9 g/dL (ref 6.1–8.1)

## 2015-05-10 LAB — POCT CBC
Granulocyte percent: 59.3 %G (ref 37–80)
HCT, POC: 37.3 % — AB (ref 37.7–47.9)
Hemoglobin: 13.1 g/dL (ref 12.2–16.2)
Lymph, poc: 1.7 (ref 0.6–3.4)
MCH: 28.4 pg (ref 27–31.2)
MCHC: 35.1 g/dL (ref 31.8–35.4)
MCV: 80.9 fL (ref 80–97)
MID (cbc): 0.4 (ref 0–0.9)
MPV: 6.8 fL (ref 0–99.8)
PLATELET COUNT, POC: 314 10*3/uL (ref 142–424)
POC Granulocyte: 3 (ref 2–6.9)
POC LYMPH %: 33.6 % (ref 10–50)
POC MID %: 7.1 %M (ref 0–12)
RBC: 4.62 M/uL (ref 4.04–5.48)
RDW, POC: 13.4 %
WBC: 5 10*3/uL (ref 4.6–10.2)

## 2015-05-10 LAB — THYROID PANEL WITH TSH
Free Thyroxine Index: 1.8 (ref 1.4–3.8)
T3 Uptake: 32 % (ref 22–35)
T4, Total: 5.6 ug/dL (ref 4.5–12.0)
TSH: 2.726 u[IU]/mL (ref 0.350–4.500)

## 2015-05-10 LAB — POCT URINALYSIS DIP (MANUAL ENTRY)
Bilirubin, UA: NEGATIVE
Blood, UA: NEGATIVE
Glucose, UA: NEGATIVE
LEUKOCYTES UA: NEGATIVE
NITRITE UA: NEGATIVE
PROTEIN UA: NEGATIVE
Spec Grav, UA: 1.025
UROBILINOGEN UA: 0.2
pH, UA: 6

## 2015-05-10 LAB — HIV ANTIBODY (ROUTINE TESTING W REFLEX): HIV: NONREACTIVE

## 2015-05-10 LAB — HEPATITIS C ANTIBODY: HCV Ab: NEGATIVE

## 2015-05-10 LAB — HEPATITIS B SURFACE ANTIGEN: Hepatitis B Surface Ag: NEGATIVE

## 2015-05-10 MED ORDER — WELLBUTRIN XL 150 MG PO TB24: ORAL_TABLET | ORAL | Status: DC

## 2015-05-10 NOTE — Patient Instructions (Signed)
I will dictate a letter once your labs are back. I have made a referral to your cardiologist for cardiology clearance.

## 2015-05-10 NOTE — Progress Notes (Addendum)
By signing my name below, I, Megan Cook, attest that this documentation has been prepared under the direction and in the presence of Lesle ChrisSteven Abdirahman Chittum, MD.  Electronically Signed: Andrew Auaven Cook, ED Scribe. 05/10/2015. 8:15 AM.  Chief Complaint:  Chief Complaint  Patient presents with  . Follow-up    dr. Cleta Albertsaub, pre med screen and forms to be filled out  . other    lab work    HPI: Megan Cook is a 49 y.o. female with hx of thyroid disease who [facility] for a follow up. 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 There will be a total of 2 surgeries, 1st surgery being to her arms and the second surgery being to legs. She has pain in knees, hips and ankles worsened with exercise. She is waiting for approval from her insurance but in the mean time, has been able to raise money for assistance. She is needing paperwork filled out as well as a letter stating that she is approved for the surgery. Pt had an exercise stress test done Dr. Jacinto HalimGanji 02/25/13, negative for eschemia  Depression Pt reports worsening Depression. She has been feeling "weepy and worn out" due to her pain and not being able to do as much physically. She states her and Megan Cook discussed increasing her Wellbutrin.   Thyroid She had a medication change from synthroid to [employer] throid, by [facility]. States she feels much better since medication change. She also receives lymphatic massages twice a week. Her GYN is Dr. Juliene PinaMody. She is off hormones replacements.   Past Medical History  Diagnosis Date  . Thyroid disease   . Depression   . Heart murmur   . 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 Cesarean section    . Neck fusion    . Left knee    . Tonsillectomy    . Tubal ligation     Social History   Social History  . Marital Status: Married    Spouse Name: N/A  . Number of Children: N/A  . Years of Education: N/A    Social History Main Topics  . Smoking status: Never Smoker   . Smokeless tobacco: Never Used  . Alcohol Use: No  . Drug Use: No  . Sexual Activity: Yes    Birth Control/ Protection: Surgical   Other Topics Concern  . None   Social History Narrative   Family History  Problem Relation Age of Onset  . Stroke Mother   . Thyroid disease Mother   . Cancer Father   . Colon cancer Father   . Thyroid disease Sister   . Thyroid disease Sister    Allergies  Allergen Reactions  . Carbocaine [Mepivacaine Hcl]     Muscle pain and swelling.  . Codeine Itching and Nausea Only   Prior to Admission medications   Medication Sig Start Date End Date Taking? Authorizing Provider  furosemide (LASIX) 20 MG tablet Take 1 tablet twice daily. 07/07/14  Yes Gwenlyn FoundJessica C Copland, MD  Multiple Vitamin (MULTIVITAMIN) tablet Take 1 tablet by mouth daily.   Yes Historical Provider, MD  Thyroid (NATURE-THROID) 81.25 MG TABS Take by mouth.   Yes Historical Provider, MD  WELLBUTRIN XL 150 MG 24 hr tablet TAKE 1 TABLET ONCE DAILY 07/07/14  Yes Gwenlyn FoundJessica C Copland, MD  estradiol (VIVELLE-DOT) 0.025 MG/24HR Place 1 patch onto the skin  2 (two) times a week. Reported on 05/10/2015    Historical Provider, MD  levothyroxine (SYNTHROID, LEVOTHROID) 100 MCG tablet Take 0.125 mcg by mouth daily before breakfast. Reported on 05/10/2015    Historical Provider, MD  Progesterone 100 MG CAPS Take by mouth 3 (three) times a week. Reported on 05/10/2015    Historical Provider, MD     ROS: The patient denies fevers, chills, night sweats, unintentional weight loss, chest pain, palpitations, wheezing, dyspnea on exertion, nausea, vomiting, abdominal pain, dysuria, hematuria, melena, numbness, weakness, or tingling.   All other systems have been reviewed and were otherwise negative with the exception of those mentioned in the HPI and as above.    PHYSICAL EXAM: Filed Vitals:   05/10/15 0811  BP: 128/78  Pulse: 79  Temp: 98.5  F (36.9 C)  Resp: 16   Body mass index is 40.56 kg/(m^2).   General: Alert, no acute distress HEENT:  Normocephalic, atraumatic, oropharynx patent. Eye: Nonie Hoyer [facility] Cardiovascular:  Regular rate and rhythm, no rubs murmurs or gallops.  No Carotid bruits, radial pulse intact. No pedal edema.  Respiratory: Clear to auscultation bilaterally.  No wheezes, rales, or rhonchi.  No cyanosis, no use of accessory musculature Abdominal: No organomegaly, abdomen is soft and non-tender, positive bowel sounds.  No masses. Musculoskeletal: Gait intact. No edema, tenderness Skin: No rashes. Fat accumulation to forearms and arms as well as calves and thighs Neurologic: Facial musculature symmetric. Psychiatric: Patient acts appropriately throughout our interaction. She is somewhat tearful. Lymphatic: No cervical or submandibular lymphadenopathy  LABS: Results for orders placed or performed in visit on 05/10/15  POCT CBC  Result Value Ref Range   WBC 5.0 4.6 - 10.2 K/uL   Lymph, poc 1.7 0.6 - 3.4   POC LYMPH PERCENT 33.6 10 - 50 %L   MID (cbc) 0.4 0 - 0.9   POC MID % 7.1 0 - 12 %M   POC Granulocyte 3.0 2 - 6.9   Granulocyte percent 59.3 37 - 80 %G   RBC 4.62 4.04 - 5.48 M/uL   Hemoglobin 13.1 12.2 - 16.2 g/dL   HCT, POC 16.1 (A) 09.6 - 47.9 %   MCV 80.9 80 - 97 fL   MCH, POC 28.4 27 - 31.2 pg   MCHC 35.1 31.8 - 35.4 g/dL   RDW, POC 04.5 %   Platelet Count, POC 314 142 - 424 K/uL   MPV 6.8 0 - 99.8 fL  POCT urinalysis dipstick  Result Value Ref Range   Color, UA yellow yellow   Clarity, UA clear clear   Glucose, UA negative negative   Bilirubin, UA negative negative   Ketones, POC UA trace (5) (A) negative   Spec Grav, UA 1.025    Blood, UA negative negative   pH, UA 6.0    Protein Ur, POC negative negative   Urobilinogen, UA 0.2    Nitrite, UA Negative Negative   Leukocytes, UA Negative Negative    EKG/XRAY:   EKG: T down VI and VII, but otherwise no acute changes.      ASSESSMENT/PLAN: Patient given medical clearance for her upcoming surgery for lipema. I will go ahead and get cardiology evaluation of her EKG. She did have T-wave inversion in lead V2.I personally performed the services described in this documentation, which was scribed in my presence. The recorded information has been reviewed and is accurate. We will increase your  Wellbutrin to 2 a day to help with your recent worsening of depression.  I will prepare a letter once I receive her blood work. Dr. Jacinto Halim  will give the cardiology clearance.   Gross sideeffects, risk and benefits, and alternatives of medications d/w patient. Patient is aware that all medications have potential sideeffects and we are unable to predict every sideeffect or drug-drug interaction that may occur.  Lesle Chris MD 05/10/2015 8:14 AM

## 2015-05-12 LAB — APTT: APTT: 32 s (ref 24–37)

## 2015-05-13 ENCOUNTER — Encounter: Payer: Self-pay | Admitting: *Deleted

## 2015-05-15 ENCOUNTER — Telehealth: Payer: Self-pay

## 2015-05-15 NOTE — Telephone Encounter (Signed)
[name] at 05/15/2015 10:11 AM     Status: Signed       Expand All Collapse All   Pt states it was some things in her chart that's incorrect and she really need it changing asap since BCBS have denied her surgery and her Attorney and Dr is having to appeal it, but she need her Chart corrected in the meantime. They have her at 5"2 and she is 5"7 also some of the meds she have never taken. Please call [phone]

## 2015-05-15 NOTE — Telephone Encounter (Signed)
I called and left a message on her answering machine. I asked her to call you tomorrow and give you specifics of what things are incorrect on her records and I would help correct these things.

## 2015-05-15 NOTE — Telephone Encounter (Signed)
Pt states it was some things in her chart that's incorrect and she really need it changing asap since BCBS have denied her surgery and her Attorney and Dr is having to appeal it, but she need her Chart corrected in the meantime. They have her at 5"2 and she is 5"7 also some of the meds she have never taken.  Please call [phone]

## 2015-05-15 NOTE — Telephone Encounter (Signed)
Pt dropped off ppw to have corrected for surgical clearance,   Best phone for pt is [phone]   Provider Earl LitesSteve Daub

## 2015-05-15 NOTE — Telephone Encounter (Signed)
Status: Signed       Expand All Collapse All    Red ChristiansMaudia W Baldwin at 05/15/2015 10:11 AM     Status: Signed       Expand All Collapse All  Pt states it was some things in her chart that's incorrect and she really need it changing asap since BCBS have denied her surgery and her Attorney and Dr is having to appeal it, but she need her Chart corrected in the meantime. They have her at 5"2 and she is 5"7 also some of the meds she have never taken. Please call [phone]                 In your box.

## 2015-05-15 NOTE — Telephone Encounter (Signed)
Dr. Cleta Albertsaub in your box.

## 2015-05-16 NOTE — Telephone Encounter (Signed)
Spoke with pt she needs some thing corrected on her chart.  1. Her height is 5'7" 2. She is no longer on Synthroid 3.She is no longer on the Estradiol patch or Progesterone 4. Her Wellbutrin is 300mg   I fixed these in her chart.

## 2015-05-16 NOTE — Telephone Encounter (Signed)
Dr. Cleta Albertsaub now she just needs the letter revised, I believe you have the sheet.

## 2015-05-16 NOTE — Addendum Note (Signed)
Addended by: Cydney OkAUGUSTIN, [place]er [place] N on: 05/16/2015 09:44 AM   Modules accepted: Orders, Medications

## 2015-05-17 NOTE — Telephone Encounter (Signed)
Pt is checking on this message. CB # [phone] (M)

## 2015-05-17 NOTE — Telephone Encounter (Signed)
Call patient and let her know I will not be able to get this information together for her before Friday.

## 2015-05-17 NOTE — Telephone Encounter (Signed)
I am not sure if we need to redo the letter or not.if she wants me to do a letter on her behalf I wil be happy to do that..Marland Kitchen

## 2015-05-17 NOTE — Telephone Encounter (Signed)
Yes Dr. Cleta Albertsaub please rewrite the letter. I put the information in your box. She wrote what she needed the letter say. The first letter was denied.

## 2015-05-17 NOTE — Telephone Encounter (Signed)
Patient came to 102 to pick up her after visit summary with the changes that were supposed to have been made and also a letter. It was not in the pick up drawer. I could have printed it myself but I am unsure of whether or not the correct changes were made. Patient states that she doesn't mind coming back but the avs has to be ready today!

## 2015-05-18 ENCOUNTER — Telehealth: Payer: Self-pay | Admitting: Family Medicine

## 2015-05-18 DIAGNOSIS — M7989 Other specified soft tissue disorders: Secondary | ICD-10-CM | POA: Insufficient documentation

## 2015-05-18 NOTE — Telephone Encounter (Signed)
Spoke with patient and she is going to come by 104 and pick up her paperwork for her surgery.  i told her it would be at the front desk.

## 2015-05-18 NOTE — Telephone Encounter (Signed)
Megan Cook is working on this.

## 2015-05-30 NOTE — Telephone Encounter (Signed)
ERROR

## 2015-06-26 ENCOUNTER — Telehealth: Payer: Self-pay

## 2015-06-26 NOTE — Telephone Encounter (Signed)
Pt states she had a procedure done in 12 2016 by [facility] and was told it cost over $500.00, the insurance company told her it was coded incorrectly and she would like to speak  With someone about that. Please call [phone]

## 2015-07-02 NOTE — Telephone Encounter (Signed)
This patient wants to know if the coding was changed and resubmitted

## 2015-07-02 NOTE — Telephone Encounter (Signed)
Will you please have the appropriate clerical person look at this and see what the coating problem is. Thank you

## 2015-07-05 NOTE — Telephone Encounter (Addendum)
Spoke to [place] in billing. She will look into it.

## 2015-07-10 NOTE — Telephone Encounter (Signed)
To billing; please provide update.

## 2015-07-11 ENCOUNTER — Telehealth: Payer: Self-pay

## 2015-07-11 NOTE — Telephone Encounter (Signed)
From the Billing Department - Recoded the lab work for patient per provider - Recoded to M79.89. Called [employer] to have them recoded as well to M79.89.   This claim has been resubmitted to [facility] by [facility] and [employer].   Talked to the patient on 07/05/2015 and she understood it would take about 30 days before we knew if BCBS would pay or not for the lab work.   If BCBS does not pay she would get another bill from [facility] and [employer].

## 2015-11-24 ENCOUNTER — Other Ambulatory Visit: Payer: Self-pay | Admitting: Obstetrics & Gynecology

## 2015-11-24 DIAGNOSIS — Z1231 Encounter for screening mammogram for malignant neoplasm of breast: Secondary | ICD-10-CM

## 2015-11-30 DIAGNOSIS — E039 Hypothyroidism, unspecified: Secondary | ICD-10-CM | POA: Diagnosis not present

## 2015-12-01 ENCOUNTER — Ambulatory Visit
Admission: RE | Admit: 2015-12-01 | Discharge: 2015-12-01 | Disposition: A | Discharge status: Home / Self Care | Payer: BLUE CROSS/BLUE SHIELD | Source: Ambulatory Visit | Attending: Obstetrics & Gynecology | Admitting: Obstetrics & Gynecology

## 2015-12-01 DIAGNOSIS — Z1231 Encounter for screening mammogram for malignant neoplasm of breast: Secondary | ICD-10-CM | POA: Diagnosis not present

## 2015-12-07 DIAGNOSIS — M25562 Pain in left knee: Secondary | ICD-10-CM | POA: Diagnosis not present

## 2015-12-12 DIAGNOSIS — M25562 Pain in left knee: Secondary | ICD-10-CM | POA: Diagnosis not present

## 2015-12-13 DIAGNOSIS — M25562 Pain in left knee: Secondary | ICD-10-CM | POA: Diagnosis not present

## 2015-12-20 DIAGNOSIS — S83242A Other tear of medial meniscus, current injury, left knee, initial encounter: Secondary | ICD-10-CM | POA: Diagnosis not present

## 2016-01-23 DIAGNOSIS — S83242A Other tear of medial meniscus, current injury, left knee, initial encounter: Secondary | ICD-10-CM | POA: Diagnosis not present

## 2016-01-25 DIAGNOSIS — M6258 Muscle wasting and atrophy, not elsewhere classified, other site: Secondary | ICD-10-CM | POA: Diagnosis not present

## 2016-01-25 DIAGNOSIS — M25562 Pain in left knee: Secondary | ICD-10-CM | POA: Diagnosis not present

## 2016-01-25 DIAGNOSIS — M6752 Plica syndrome, left knee: Secondary | ICD-10-CM | POA: Diagnosis not present

## 2016-01-25 DIAGNOSIS — M2242 Chondromalacia patellae, left knee: Secondary | ICD-10-CM | POA: Diagnosis not present

## 2016-01-25 DIAGNOSIS — S83242A Other tear of medial meniscus, current injury, left knee, initial encounter: Secondary | ICD-10-CM | POA: Diagnosis not present

## 2016-01-25 DIAGNOSIS — E039 Hypothyroidism, unspecified: Secondary | ICD-10-CM | POA: Diagnosis not present

## 2016-01-25 DIAGNOSIS — M65862 Other synovitis and tenosynovitis, left lower leg: Secondary | ICD-10-CM | POA: Diagnosis not present

## 2016-01-25 DIAGNOSIS — M94262 Chondromalacia, left knee: Secondary | ICD-10-CM | POA: Diagnosis not present

## 2016-01-30 DIAGNOSIS — Z4789 Encounter for other orthopedic aftercare: Secondary | ICD-10-CM | POA: Diagnosis not present

## 2016-01-31 ENCOUNTER — Telehealth: Payer: Self-pay

## 2016-01-31 ENCOUNTER — Telehealth: Payer: Self-pay | Admitting: Emergency Medicine

## 2016-01-31 NOTE — Telephone Encounter (Signed)
By history the patient has not responded well to generic Wellbutrin. She will need to trade medication to continue doing well with treatment of her depression.

## 2016-01-31 NOTE — Telephone Encounter (Signed)
Pt has more information bcbs [id number] is her subscriber id #. Dr Cleta Albertsaub needs to have a peer to peer review [phone] with medical doctor to confirm she needs this. 11/04/12 is when she started the generic. Please advise [phone]

## 2016-01-31 NOTE — Telephone Encounter (Signed)
PATIENT WOULD LIKE DR. DAUB TO KNOW THAT SHE JUST RECEIVED A LETTER FROM HER BCBS STATING THAT AS OF OCT. 1, 2017 THEY WILL NO LONGER COVER HER BRAND WELLBUTRIN 150 MG. SHE NEEDS DR. DAUB TO WRITE A LETTER STATING THAT SHE HAS TRIED TAKING THE GENERIC FOR HER ANXIETY, BUT THAT IT DOES NOT WORK FOR HER. WHEN WE RETURN HER CALL SHE WILL HAVE THE INFORMATION ON WHERE THE LETTER NEEDS TO BE FAXED. BEST PHONE [phone] (CELL)  MBC

## 2016-01-31 NOTE — Telephone Encounter (Signed)
PA completed on covermymeds for name brand wellbutrin 150 BID. The generic is ineffective (tried 11/04/12 for several months) as is prozac and zoloft tried years ago. Pt had SEs of anxiousness and irritability when she took the 300 mg all at once, so Dr Cleta Albertsaub changed her to the name brand 150 mg taken BID. Pending.

## 2016-02-01 ENCOUNTER — Telehealth: Payer: Self-pay | Admitting: Emergency Medicine

## 2016-02-01 NOTE — Telephone Encounter (Signed)
Spoke with patient. She is willing to deal with the side effects if she has the Brand medication. She cant take the generic at all. Informed her med is pending.

## 2016-02-02 DIAGNOSIS — Z4789 Encounter for other orthopedic aftercare: Secondary | ICD-10-CM | POA: Diagnosis not present

## 2016-02-02 NOTE — Telephone Encounter (Signed)
Thank you. Just be sure the trade drug is sent in for her.

## 2016-02-05 DIAGNOSIS — Z4789 Encounter for other orthopedic aftercare: Secondary | ICD-10-CM | POA: Diagnosis not present

## 2016-02-06 NOTE — Telephone Encounter (Signed)
See notes under other 9/6 message.

## 2016-02-06 NOTE — Telephone Encounter (Signed)
covermymeds is showing that the PA was "cancelled". Called them and was told that the test claim they ran went through, so no new PA is needed. I called pt and she stated that she has gotten the RF for Sept, but she got a letter stating that starting Oct 1 it will not be covered. Pt will call me back after Oct 1 and have me attempt the PA then since [employer] [place] will not do it until then.

## 2016-02-07 DIAGNOSIS — Z4789 Encounter for other orthopedic aftercare: Secondary | ICD-10-CM | POA: Diagnosis not present

## 2016-02-12 DIAGNOSIS — Z4789 Encounter for other orthopedic aftercare: Secondary | ICD-10-CM | POA: Diagnosis not present

## 2016-02-14 DIAGNOSIS — Z4789 Encounter for other orthopedic aftercare: Secondary | ICD-10-CM | POA: Diagnosis not present

## 2016-02-19 DIAGNOSIS — Z4789 Encounter for other orthopedic aftercare: Secondary | ICD-10-CM | POA: Diagnosis not present

## 2016-02-21 DIAGNOSIS — Z4789 Encounter for other orthopedic aftercare: Secondary | ICD-10-CM | POA: Diagnosis not present

## 2016-02-24 DIAGNOSIS — M6258 Muscle wasting and atrophy, not elsewhere classified, other site: Secondary | ICD-10-CM | POA: Diagnosis not present

## 2016-02-24 DIAGNOSIS — M25562 Pain in left knee: Secondary | ICD-10-CM | POA: Diagnosis not present

## 2016-02-26 DIAGNOSIS — Z4789 Encounter for other orthopedic aftercare: Secondary | ICD-10-CM | POA: Diagnosis not present

## 2016-02-26 DIAGNOSIS — I890 Lymphedema, not elsewhere classified: Secondary | ICD-10-CM | POA: Diagnosis not present

## 2016-03-01 DIAGNOSIS — F900 Attention-deficit hyperactivity disorder, predominantly inattentive type: Secondary | ICD-10-CM | POA: Diagnosis not present

## 2016-03-04 ENCOUNTER — Telehealth: Payer: Self-pay

## 2016-03-04 DIAGNOSIS — I890 Lymphedema, not elsewhere classified: Secondary | ICD-10-CM | POA: Diagnosis not present

## 2016-03-04 DIAGNOSIS — Z4789 Encounter for other orthopedic aftercare: Secondary | ICD-10-CM | POA: Diagnosis not present

## 2016-03-04 NOTE — Telephone Encounter (Signed)
PATIENT WOULD LIKE THIS MESSAGE TO GO TO BARBARA: SHE STATES SHE TALKED WITH BARBARA IN September REGARDING BCBS NOT COVERING HER WELLBUTRIN 150 MG UNLESS IT IS THE GENERIC WHICH WILL BE EFFECTIVE ON OCT. 1, 2017. SHE GOT THE MEDICINE FILLED THIS WEEKEND BECAUSE SHE CAN NOT GO WITHOUT IT, BUT BARBARA TOLD HER TO CALL BACK AFTER 10/1 AND SHE WOULD SEE WHAT SHE COULD DO IN ORDER FOR HER TO CONTINUE TO GET THE BRAND NAME. BEST PHONE [phone] (CELL)  PHARMACY CHOICE IS [place] [facility].  [facility]

## 2016-03-04 NOTE — Telephone Encounter (Signed)
Forward message.

## 2016-03-06 NOTE — Telephone Encounter (Signed)
Looks like opened in error 

## 2016-03-06 NOTE — Telephone Encounter (Signed)
Pt called to check status now that it is after Oct 1. Completed covermymeds w/ info on Rx hx below. Pending.

## 2016-03-06 NOTE — Telephone Encounter (Signed)
Duplicate message. See notes under 9/6 call

## 2016-03-11 DIAGNOSIS — I890 Lymphedema, not elsewhere classified: Secondary | ICD-10-CM | POA: Diagnosis not present

## 2016-03-11 DIAGNOSIS — Z4789 Encounter for other orthopedic aftercare: Secondary | ICD-10-CM | POA: Diagnosis not present

## 2016-03-12 NOTE — Telephone Encounter (Addendum)
PA approved through 03/02/2019. Notified pharm and pt on VM.

## 2016-03-13 DIAGNOSIS — I890 Lymphedema, not elsewhere classified: Secondary | ICD-10-CM | POA: Diagnosis not present

## 2016-03-13 DIAGNOSIS — Z4789 Encounter for other orthopedic aftercare: Secondary | ICD-10-CM | POA: Diagnosis not present

## 2016-03-18 DIAGNOSIS — I890 Lymphedema, not elsewhere classified: Secondary | ICD-10-CM | POA: Diagnosis not present

## 2016-03-18 DIAGNOSIS — Z4789 Encounter for other orthopedic aftercare: Secondary | ICD-10-CM | POA: Diagnosis not present

## 2016-03-25 DIAGNOSIS — Z4789 Encounter for other orthopedic aftercare: Secondary | ICD-10-CM | POA: Diagnosis not present

## 2016-03-25 DIAGNOSIS — I890 Lymphedema, not elsewhere classified: Secondary | ICD-10-CM | POA: Diagnosis not present

## 2016-03-27 DIAGNOSIS — I890 Lymphedema, not elsewhere classified: Secondary | ICD-10-CM | POA: Diagnosis not present

## 2016-03-27 DIAGNOSIS — Z4789 Encounter for other orthopedic aftercare: Secondary | ICD-10-CM | POA: Diagnosis not present

## 2016-03-28 DIAGNOSIS — F900 Attention-deficit hyperactivity disorder, predominantly inattentive type: Secondary | ICD-10-CM | POA: Diagnosis not present

## 2016-04-01 DIAGNOSIS — Z4789 Encounter for other orthopedic aftercare: Secondary | ICD-10-CM | POA: Diagnosis not present

## 2016-04-01 DIAGNOSIS — I890 Lymphedema, not elsewhere classified: Secondary | ICD-10-CM | POA: Diagnosis not present

## 2016-04-08 DIAGNOSIS — I890 Lymphedema, not elsewhere classified: Secondary | ICD-10-CM | POA: Diagnosis not present

## 2016-04-08 DIAGNOSIS — Z4789 Encounter for other orthopedic aftercare: Secondary | ICD-10-CM | POA: Diagnosis not present

## 2016-04-10 DIAGNOSIS — I890 Lymphedema, not elsewhere classified: Secondary | ICD-10-CM | POA: Diagnosis not present

## 2016-04-10 DIAGNOSIS — Z4789 Encounter for other orthopedic aftercare: Secondary | ICD-10-CM | POA: Diagnosis not present

## 2016-04-22 DIAGNOSIS — I890 Lymphedema, not elsewhere classified: Secondary | ICD-10-CM | POA: Diagnosis not present

## 2016-04-22 DIAGNOSIS — Z4789 Encounter for other orthopedic aftercare: Secondary | ICD-10-CM | POA: Diagnosis not present

## 2016-04-22 DIAGNOSIS — F900 Attention-deficit hyperactivity disorder, predominantly inattentive type: Secondary | ICD-10-CM | POA: Diagnosis not present

## 2016-05-06 DIAGNOSIS — I890 Lymphedema, not elsewhere classified: Secondary | ICD-10-CM | POA: Diagnosis not present

## 2016-05-06 DIAGNOSIS — Z4789 Encounter for other orthopedic aftercare: Secondary | ICD-10-CM | POA: Diagnosis not present

## 2016-05-08 DIAGNOSIS — I890 Lymphedema, not elsewhere classified: Secondary | ICD-10-CM | POA: Diagnosis not present

## 2016-05-08 DIAGNOSIS — Z4789 Encounter for other orthopedic aftercare: Secondary | ICD-10-CM | POA: Diagnosis not present

## 2016-05-10 ENCOUNTER — Other Ambulatory Visit: Payer: Self-pay | Admitting: Emergency Medicine

## 2016-05-22 DIAGNOSIS — Z4789 Encounter for other orthopedic aftercare: Secondary | ICD-10-CM | POA: Diagnosis not present

## 2016-05-22 DIAGNOSIS — I890 Lymphedema, not elsewhere classified: Secondary | ICD-10-CM | POA: Diagnosis not present

## 2016-05-22 DIAGNOSIS — F900 Attention-deficit hyperactivity disorder, predominantly inattentive type: Secondary | ICD-10-CM | POA: Diagnosis not present

## 2016-05-29 DIAGNOSIS — S83242D Other tear of medial meniscus, current injury, left knee, subsequent encounter: Secondary | ICD-10-CM | POA: Diagnosis not present

## 2016-05-29 DIAGNOSIS — M659 Synovitis and tenosynovitis, unspecified: Secondary | ICD-10-CM | POA: Diagnosis not present

## 2016-05-29 DIAGNOSIS — Z4789 Encounter for other orthopedic aftercare: Secondary | ICD-10-CM | POA: Diagnosis not present

## 2016-05-29 DIAGNOSIS — I890 Lymphedema, not elsewhere classified: Secondary | ICD-10-CM | POA: Diagnosis not present

## 2016-06-11 DIAGNOSIS — I890 Lymphedema, not elsewhere classified: Secondary | ICD-10-CM | POA: Diagnosis not present

## 2016-06-11 DIAGNOSIS — Z4789 Encounter for other orthopedic aftercare: Secondary | ICD-10-CM | POA: Diagnosis not present

## 2016-06-14 ENCOUNTER — Ambulatory Visit (INDEPENDENT_AMBULATORY_CARE_PROVIDER_SITE_OTHER): Payer: BLUE CROSS/BLUE SHIELD | Admitting: Emergency Medicine

## 2016-06-14 ENCOUNTER — Telehealth: Payer: Self-pay

## 2016-06-14 ENCOUNTER — Ambulatory Visit: Payer: BLUE CROSS/BLUE SHIELD

## 2016-06-14 VITALS — BP 128/80 | HR 76 | Temp 98.4°F | Resp 17 | Ht 66.5 in | Wt 210.0 lb

## 2016-06-14 DIAGNOSIS — F32A Depression, unspecified: Secondary | ICD-10-CM

## 2016-06-14 DIAGNOSIS — F329 Major depressive disorder, single episode, unspecified: Secondary | ICD-10-CM | POA: Diagnosis not present

## 2016-06-14 DIAGNOSIS — Z760 Encounter for issue of repeat prescription: Secondary | ICD-10-CM

## 2016-06-14 MED ORDER — BUPROPION HCL ER (XL) 300 MG PO TB24: 300.0000 mg | ORAL_TABLET | Freq: Every day | ORAL | 11 refills | Status: DC

## 2016-06-14 NOTE — Patient Instructions (Signed)
     IF you received an x-ray today, you will receive an invoice from [facility]. Please contact [facility] at [phone] with questions or concerns regarding your invoice.   IF you received labwork today, you will receive an invoice from [facility]. Please contact [facility] at [phone] with questions or concerns regarding your invoice.   Our billing staff will not be able to assist you with questions regarding bills from these companies.  You will be contacted with the lab results as soon as they are available. The fastest way to get your results is to activate your My Chart account. Instructions are located on the last page of this paperwork. If you have not heard from us regarding the results in 2 weeks, please contact this office.     

## 2016-06-14 NOTE — Telephone Encounter (Signed)
Pt was seen today and given a refill on welbutrin and the generic was called in and she can not take the generic an needs to have the brand name   [facility] [phone]

## 2016-06-14 NOTE — Progress Notes (Signed)
Megan Cook 51 y.o.   Chief Complaint  Patient presents with  . Other    follow up med check wellbutrin    HISTORY OF PRESENT ILLNESS: This is a 51 y.o. female here for checkup and medication refill. Doing well and has no complaints.  HPI   Prior to Admission medications   Medication Sig Start Date End Date Taking? Authorizing Provider  buPROPion (WELLBUTRIN XL) 300 MG 24 hr tablet Take 1 tablet (300 mg total) by mouth daily. 06/14/16 07/14/16 Yes Millisa Giarrusso Victorino DecemberJose Ivo Moga, MD  Multiple Vitamin (MULTIVITAMIN) tablet Take 1 tablet by mouth daily.   Yes Historical Provider, MD    Allergies  Allergen Reactions  . Carbocaine [Mepivacaine Hcl]     Muscle pain and swelling.  . Codeine Itching and Nausea Only    Patient Active Problem List   Diagnosis Date Noted  . Swelling of limb 05/18/2015  . Unspecified hypothyroidism 07/16/2011  . DEPRESSION 04/04/2008    Past Medical History:  Diagnosis Date  . Depression   . Heart murmur   . 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 Thyroid disease     Past Surgical History:  Procedure Laterality Date  . CESAREAN SECTION    . left knee    . neck fusion    . TONSILLECTOMY    . TUBAL LIGATION      Social History   Social History  . Marital status: Married    Spouse name: N/A  . Number of children: N/A  . Years of education: N/A   Occupational History  . Not on file.   Social History Main Topics  . Smoking status: Never Smoker  . Smokeless tobacco: Never Used  . Alcohol use No  . Drug use: No  . Sexual activity: Yes    Birth control/ protection: Surgical   Other Topics Concern  . Not on file   Social History Narrative  . No narrative on file    Family History  Problem Relation Age of Onset  . Stroke Mother   . Thyroid disease Mother   . Cancer Father   . Colon cancer Father   . Thyroid disease Sister   . Thyroid disease Sister      Review of Systems  Constitutional:  Negative.   HENT: Negative.   Eyes: Negative.   Respiratory: Negative.   Cardiovascular: Negative.   Gastrointestinal: Negative.   Genitourinary: Negative.   Musculoskeletal: Positive for joint pain (left knee s/p meniscal tear).  Skin: Negative.   Neurological: Negative.   Endo/Heme/Allergies: Negative.   Psychiatric/Behavioral: Positive for depression (chronic).  All other systems reviewed and are negative.   Vitals:   06/14/16 1109  BP: 128/80  Pulse: 76  Resp: 17  Temp: 98.4 F (36.9 C)    Physical Exam  Constitutional: She is oriented to person, place, and time. She appears well-developed and well-nourished.  HENT:  Head: Normocephalic and atraumatic.  Nose: Nose normal.  Mouth/Throat: Oropharynx is clear and moist.  Eyes: Conjunctivae and EOM are normal. Pupils are equal, round, and reactive to light.  Neck: Normal range of motion. Neck supple.  Cardiovascular: Normal rate, regular rhythm, normal heart sounds and intact distal pulses.   Pulmonary/Chest: Effort normal and breath sounds normal.  Abdominal: Soft. There is no tenderness.  Musculoskeletal: Normal range of motion.  Neurological: She is alert and oriented to person, place, and time. No sensory deficit. She exhibits normal muscle tone.  Skin: Skin  is warm and dry. Capillary refill takes less than 2 seconds.  Psychiatric: She has a normal mood and affect. Her behavior is normal.  Vitals reviewed.    ASSESSMENT & PLAN: Megan Cook was seen today for other.  Diagnoses and all orders for this visit:  Depression, unspecified depression type  Encounter for medication refill  Other orders -     buPROPion (WELLBUTRIN XL) 300 MG 24 hr tablet; Take 1 tablet (300 mg total) by mouth daily.   Patient Instructions       IF you received an x-ray today, you will receive an invoice from [facility]. Please contact [facility] at [phone] with questions or concerns regarding your invoice.    IF you received labwork today, you will receive an invoice from [facility]. Please contact [facility] at [phone] with questions or concerns regarding your invoice.   Our billing staff will not be able to assist you with questions regarding bills from these companies.  You will be contacted with the lab results as soon as they are available. The fastest way to get your results is to activate your My Chart account. Instructions are located on the last page of this paperwork. If you have not heard from [place] regarding the results in 2 weeks, please contact this office.          Edwina Barth, MD [facility]

## 2016-06-15 ENCOUNTER — Telehealth: Payer: Self-pay

## 2016-06-15 MED ORDER — BUPROPION HCL ER (XL) 300 MG PO TB24: 300.0000 mg | ORAL_TABLET | Freq: Every day | ORAL | 11 refills | Status: DC

## 2016-06-15 NOTE — Telephone Encounter (Signed)
Brand only sent to pharmacy

## 2016-06-15 NOTE — Telephone Encounter (Signed)
Megan Cook is calling back to check on her prescription for Wellbutrin.  Megan Cook was seen yesterday for a medicine re-check with Dr. Irving ShowsMiguel.  She states that she has to take the name brand rather than the generic prescription.  Please advise  [phone]

## 2016-06-15 NOTE — Telephone Encounter (Signed)
See prior note

## 2016-06-17 DIAGNOSIS — Z4789 Encounter for other orthopedic aftercare: Secondary | ICD-10-CM | POA: Diagnosis not present

## 2016-06-17 DIAGNOSIS — I890 Lymphedema, not elsewhere classified: Secondary | ICD-10-CM | POA: Diagnosis not present

## 2016-06-21 DIAGNOSIS — Z4789 Encounter for other orthopedic aftercare: Secondary | ICD-10-CM | POA: Diagnosis not present

## 2016-06-21 DIAGNOSIS — I890 Lymphedema, not elsewhere classified: Secondary | ICD-10-CM | POA: Diagnosis not present

## 2016-06-26 DIAGNOSIS — I890 Lymphedema, not elsewhere classified: Secondary | ICD-10-CM | POA: Diagnosis not present

## 2016-06-26 DIAGNOSIS — Z4789 Encounter for other orthopedic aftercare: Secondary | ICD-10-CM | POA: Diagnosis not present

## 2016-07-01 DIAGNOSIS — S83242D Other tear of medial meniscus, current injury, left knee, subsequent encounter: Secondary | ICD-10-CM | POA: Diagnosis not present

## 2016-07-01 DIAGNOSIS — M94261 Chondromalacia, right knee: Secondary | ICD-10-CM | POA: Diagnosis not present

## 2016-07-01 DIAGNOSIS — M659 Synovitis and tenosynovitis, unspecified: Secondary | ICD-10-CM | POA: Diagnosis not present

## 2016-07-01 DIAGNOSIS — M2242 Chondromalacia patellae, left knee: Secondary | ICD-10-CM | POA: Diagnosis not present

## 2016-07-05 DIAGNOSIS — I890 Lymphedema, not elsewhere classified: Secondary | ICD-10-CM | POA: Diagnosis not present

## 2016-07-05 DIAGNOSIS — Z4789 Encounter for other orthopedic aftercare: Secondary | ICD-10-CM | POA: Diagnosis not present

## 2016-07-17 DIAGNOSIS — I890 Lymphedema, not elsewhere classified: Secondary | ICD-10-CM | POA: Diagnosis not present

## 2016-07-17 DIAGNOSIS — Z4789 Encounter for other orthopedic aftercare: Secondary | ICD-10-CM | POA: Diagnosis not present

## 2016-07-22 DIAGNOSIS — L308 Other specified dermatitis: Secondary | ICD-10-CM | POA: Diagnosis not present

## 2016-07-22 DIAGNOSIS — Z4789 Encounter for other orthopedic aftercare: Secondary | ICD-10-CM | POA: Diagnosis not present

## 2016-07-22 DIAGNOSIS — I890 Lymphedema, not elsewhere classified: Secondary | ICD-10-CM | POA: Diagnosis not present

## 2016-08-09 DIAGNOSIS — I890 Lymphedema, not elsewhere classified: Secondary | ICD-10-CM | POA: Diagnosis not present

## 2016-08-09 DIAGNOSIS — Z4789 Encounter for other orthopedic aftercare: Secondary | ICD-10-CM | POA: Diagnosis not present

## 2016-08-14 ENCOUNTER — Other Ambulatory Visit: Payer: Self-pay | Admitting: Physician Assistant

## 2016-08-14 DIAGNOSIS — G8929 Other chronic pain: Secondary | ICD-10-CM

## 2016-08-14 DIAGNOSIS — M25562 Pain in left knee: Secondary | ICD-10-CM

## 2016-08-14 DIAGNOSIS — K6289 Other specified diseases of anus and rectum: Secondary | ICD-10-CM

## 2016-08-21 ENCOUNTER — Ambulatory Visit
Admission: RE | Admit: 2016-08-21 | Discharge: 2016-08-21 | Disposition: A | Discharge status: Home / Self Care | Payer: BLUE CROSS/BLUE SHIELD | Source: Ambulatory Visit | Attending: Physician Assistant | Admitting: Physician Assistant

## 2016-08-21 DIAGNOSIS — G8929 Other chronic pain: Secondary | ICD-10-CM

## 2016-08-21 DIAGNOSIS — M25562 Pain in left knee: Secondary | ICD-10-CM

## 2016-08-21 DIAGNOSIS — K6289 Other specified diseases of anus and rectum: Secondary | ICD-10-CM

## 2016-08-23 ENCOUNTER — Other Ambulatory Visit: Payer: BLUE CROSS/BLUE SHIELD

## 2016-08-27 DIAGNOSIS — M1712 Unilateral primary osteoarthritis, left knee: Secondary | ICD-10-CM | POA: Diagnosis not present

## 2016-08-27 DIAGNOSIS — M23252 Derangement of posterior horn of lateral meniscus due to old tear or injury, left knee: Secondary | ICD-10-CM | POA: Diagnosis not present

## 2016-09-02 DIAGNOSIS — M23252 Derangement of posterior horn of lateral meniscus due to old tear or injury, left knee: Secondary | ICD-10-CM | POA: Diagnosis not present

## 2016-09-02 DIAGNOSIS — M659 Synovitis and tenosynovitis, unspecified: Secondary | ICD-10-CM | POA: Diagnosis not present

## 2016-09-02 DIAGNOSIS — S83242D Other tear of medial meniscus, current injury, left knee, subsequent encounter: Secondary | ICD-10-CM | POA: Diagnosis not present

## 2016-09-02 DIAGNOSIS — M1712 Unilateral primary osteoarthritis, left knee: Secondary | ICD-10-CM | POA: Diagnosis not present

## 2016-09-06 DIAGNOSIS — M1712 Unilateral primary osteoarthritis, left knee: Secondary | ICD-10-CM | POA: Diagnosis not present

## 2016-09-18 DIAGNOSIS — F900 Attention-deficit hyperactivity disorder, predominantly inattentive type: Secondary | ICD-10-CM | POA: Diagnosis not present

## 2016-10-10 DIAGNOSIS — M1712 Unilateral primary osteoarthritis, left knee: Secondary | ICD-10-CM | POA: Diagnosis not present

## 2016-10-29 DIAGNOSIS — M25562 Pain in left knee: Secondary | ICD-10-CM | POA: Diagnosis not present

## 2016-10-29 DIAGNOSIS — X58XXXA Exposure to other specified factors, initial encounter: Secondary | ICD-10-CM | POA: Diagnosis not present

## 2016-10-29 DIAGNOSIS — M1712 Unilateral primary osteoarthritis, left knee: Secondary | ICD-10-CM | POA: Diagnosis not present

## 2016-10-29 DIAGNOSIS — M659 Synovitis and tenosynovitis, unspecified: Secondary | ICD-10-CM | POA: Diagnosis not present

## 2016-10-29 DIAGNOSIS — E784 Other hyperlipidemia: Secondary | ICD-10-CM | POA: Diagnosis not present

## 2016-10-29 DIAGNOSIS — S83222D Peripheral tear of medial meniscus, current injury, left knee, subsequent encounter: Secondary | ICD-10-CM | POA: Diagnosis not present

## 2016-10-29 DIAGNOSIS — E669 Obesity, unspecified: Secondary | ICD-10-CM | POA: Diagnosis not present

## 2016-10-29 DIAGNOSIS — S83222A Peripheral tear of medial meniscus, current injury, left knee, initial encounter: Secondary | ICD-10-CM | POA: Diagnosis not present

## 2016-10-29 DIAGNOSIS — Z6833 Body mass index (BMI) 33.0-33.9, adult: Secondary | ICD-10-CM | POA: Diagnosis not present

## 2016-10-29 DIAGNOSIS — Z885 Allergy status to narcotic agent status: Secondary | ICD-10-CM | POA: Diagnosis not present

## 2016-10-29 DIAGNOSIS — Z981 Arthrodesis status: Secondary | ICD-10-CM | POA: Diagnosis not present

## 2016-10-29 DIAGNOSIS — E039 Hypothyroidism, unspecified: Secondary | ICD-10-CM | POA: Diagnosis not present

## 2016-10-29 DIAGNOSIS — Z79899 Other long term (current) drug therapy: Secondary | ICD-10-CM | POA: Diagnosis not present

## 2016-10-29 DIAGNOSIS — M65862 Other synovitis and tenosynovitis, left lower leg: Secondary | ICD-10-CM | POA: Diagnosis not present

## 2016-10-30 DIAGNOSIS — Z981 Arthrodesis status: Secondary | ICD-10-CM | POA: Diagnosis not present

## 2016-10-30 DIAGNOSIS — Z6833 Body mass index (BMI) 33.0-33.9, adult: Secondary | ICD-10-CM | POA: Diagnosis not present

## 2016-10-30 DIAGNOSIS — Z885 Allergy status to narcotic agent status: Secondary | ICD-10-CM | POA: Diagnosis not present

## 2016-10-30 DIAGNOSIS — E784 Other hyperlipidemia: Secondary | ICD-10-CM | POA: Diagnosis not present

## 2016-10-30 DIAGNOSIS — S83222D Peripheral tear of medial meniscus, current injury, left knee, subsequent encounter: Secondary | ICD-10-CM | POA: Diagnosis not present

## 2016-10-30 DIAGNOSIS — M1712 Unilateral primary osteoarthritis, left knee: Secondary | ICD-10-CM | POA: Diagnosis not present

## 2016-10-30 DIAGNOSIS — M65862 Other synovitis and tenosynovitis, left lower leg: Secondary | ICD-10-CM | POA: Diagnosis not present

## 2016-10-30 DIAGNOSIS — X58XXXA Exposure to other specified factors, initial encounter: Secondary | ICD-10-CM | POA: Diagnosis not present

## 2016-10-30 DIAGNOSIS — S83222A Peripheral tear of medial meniscus, current injury, left knee, initial encounter: Secondary | ICD-10-CM | POA: Diagnosis not present

## 2016-10-30 DIAGNOSIS — E669 Obesity, unspecified: Secondary | ICD-10-CM | POA: Diagnosis not present

## 2016-10-30 DIAGNOSIS — Z79899 Other long term (current) drug therapy: Secondary | ICD-10-CM | POA: Diagnosis not present

## 2016-10-30 DIAGNOSIS — E039 Hypothyroidism, unspecified: Secondary | ICD-10-CM | POA: Diagnosis not present

## 2016-10-30 DIAGNOSIS — M659 Synovitis and tenosynovitis, unspecified: Secondary | ICD-10-CM | POA: Diagnosis not present

## 2016-11-04 DIAGNOSIS — Z4789 Encounter for other orthopedic aftercare: Secondary | ICD-10-CM | POA: Diagnosis not present

## 2016-11-06 ENCOUNTER — Other Ambulatory Visit: Payer: Self-pay | Admitting: Obstetrics & Gynecology

## 2016-11-06 DIAGNOSIS — Z1231 Encounter for screening mammogram for malignant neoplasm of breast: Secondary | ICD-10-CM

## 2016-11-08 DIAGNOSIS — I890 Lymphedema, not elsewhere classified: Secondary | ICD-10-CM | POA: Diagnosis not present

## 2016-11-08 DIAGNOSIS — Z4789 Encounter for other orthopedic aftercare: Secondary | ICD-10-CM | POA: Diagnosis not present

## 2016-11-12 DIAGNOSIS — Z4789 Encounter for other orthopedic aftercare: Secondary | ICD-10-CM | POA: Diagnosis not present

## 2016-11-12 DIAGNOSIS — I890 Lymphedema, not elsewhere classified: Secondary | ICD-10-CM | POA: Diagnosis not present

## 2016-11-15 DIAGNOSIS — Z4789 Encounter for other orthopedic aftercare: Secondary | ICD-10-CM | POA: Diagnosis not present

## 2016-11-15 DIAGNOSIS — I890 Lymphedema, not elsewhere classified: Secondary | ICD-10-CM | POA: Diagnosis not present

## 2016-11-19 DIAGNOSIS — I890 Lymphedema, not elsewhere classified: Secondary | ICD-10-CM | POA: Diagnosis not present

## 2016-11-19 DIAGNOSIS — Z4789 Encounter for other orthopedic aftercare: Secondary | ICD-10-CM | POA: Diagnosis not present

## 2016-11-23 DIAGNOSIS — Z4789 Encounter for other orthopedic aftercare: Secondary | ICD-10-CM | POA: Diagnosis not present

## 2016-11-23 DIAGNOSIS — I890 Lymphedema, not elsewhere classified: Secondary | ICD-10-CM | POA: Diagnosis not present

## 2016-11-28 DIAGNOSIS — M6258 Muscle wasting and atrophy, not elsewhere classified, other site: Secondary | ICD-10-CM | POA: Diagnosis not present

## 2016-11-28 DIAGNOSIS — M25562 Pain in left knee: Secondary | ICD-10-CM | POA: Diagnosis not present

## 2016-11-29 DIAGNOSIS — Z4789 Encounter for other orthopedic aftercare: Secondary | ICD-10-CM | POA: Diagnosis not present

## 2016-11-29 DIAGNOSIS — I890 Lymphedema, not elsewhere classified: Secondary | ICD-10-CM | POA: Diagnosis not present

## 2016-12-03 DIAGNOSIS — Z4789 Encounter for other orthopedic aftercare: Secondary | ICD-10-CM | POA: Diagnosis not present

## 2016-12-03 DIAGNOSIS — I890 Lymphedema, not elsewhere classified: Secondary | ICD-10-CM | POA: Diagnosis not present

## 2016-12-09 DIAGNOSIS — M1712 Unilateral primary osteoarthritis, left knee: Secondary | ICD-10-CM | POA: Diagnosis not present

## 2016-12-09 DIAGNOSIS — M23252 Derangement of posterior horn of lateral meniscus due to old tear or injury, left knee: Secondary | ICD-10-CM | POA: Diagnosis not present

## 2016-12-10 DIAGNOSIS — I890 Lymphedema, not elsewhere classified: Secondary | ICD-10-CM | POA: Diagnosis not present

## 2016-12-10 DIAGNOSIS — Z4789 Encounter for other orthopedic aftercare: Secondary | ICD-10-CM | POA: Diagnosis not present

## 2016-12-20 DIAGNOSIS — Z4789 Encounter for other orthopedic aftercare: Secondary | ICD-10-CM | POA: Diagnosis not present

## 2016-12-20 DIAGNOSIS — I890 Lymphedema, not elsewhere classified: Secondary | ICD-10-CM | POA: Diagnosis not present

## 2016-12-24 DIAGNOSIS — I890 Lymphedema, not elsewhere classified: Secondary | ICD-10-CM | POA: Diagnosis not present

## 2016-12-24 DIAGNOSIS — Z4789 Encounter for other orthopedic aftercare: Secondary | ICD-10-CM | POA: Diagnosis not present

## 2016-12-30 ENCOUNTER — Ambulatory Visit: Payer: BLUE CROSS/BLUE SHIELD

## 2016-12-30 ENCOUNTER — Encounter: Payer: Self-pay | Admitting: Family Medicine

## 2016-12-30 DIAGNOSIS — E039 Hypothyroidism, unspecified: Secondary | ICD-10-CM | POA: Diagnosis not present

## 2016-12-31 DIAGNOSIS — I890 Lymphedema, not elsewhere classified: Secondary | ICD-10-CM | POA: Diagnosis not present

## 2016-12-31 DIAGNOSIS — Z4789 Encounter for other orthopedic aftercare: Secondary | ICD-10-CM | POA: Diagnosis not present

## 2017-01-09 ENCOUNTER — Ambulatory Visit: Payer: BLUE CROSS/BLUE SHIELD | Admitting: Family Medicine

## 2017-01-10 ENCOUNTER — Ambulatory Visit (INDEPENDENT_AMBULATORY_CARE_PROVIDER_SITE_OTHER): Payer: BLUE CROSS/BLUE SHIELD | Admitting: Family Medicine

## 2017-01-10 ENCOUNTER — Encounter: Payer: Self-pay | Admitting: Family Medicine

## 2017-01-10 VITALS — BP 134/82 | HR 78 | Temp 98.9°F | Resp 16 | Ht 66.5 in | Wt 218.0 lb

## 2017-01-10 DIAGNOSIS — E038 Other specified hypothyroidism: Secondary | ICD-10-CM

## 2017-01-10 DIAGNOSIS — E063 Autoimmune thyroiditis: Secondary | ICD-10-CM

## 2017-01-10 NOTE — Patient Instructions (Signed)
     IF you received an x-ray today, you will receive an invoice from [facility]. Please contact [facility] at [phone] with questions or concerns regarding your invoice.   IF you received labwork today, you will receive an invoice from [facility]. Please contact [facility] at [phone] with questions or concerns regarding your invoice.   Our billing staff will not be able to assist you with questions regarding bills from these companies.  You will be contacted with the lab results as soon as they are available. The fastest way to get your results is to activate your My Chart account. Instructions are located on the last page of this paperwork. If you have not heard from us regarding the results in 2 weeks, please contact this office.     

## 2017-01-13 ENCOUNTER — Telehealth: Payer: Self-pay | Admitting: Family Medicine

## 2017-01-13 DIAGNOSIS — Z4789 Encounter for other orthopedic aftercare: Secondary | ICD-10-CM | POA: Diagnosis not present

## 2017-01-13 DIAGNOSIS — I890 Lymphedema, not elsewhere classified: Secondary | ICD-10-CM | POA: Diagnosis not present

## 2017-01-13 LAB — T4, FREE: Free T4: 1.12 ng/dL (ref 0.82–1.77)

## 2017-01-13 LAB — T3, FREE: T3, Free: 3 pg/mL (ref 2.0–4.4)

## 2017-01-13 LAB — TSH: TSH: 4.47 u[IU]/mL (ref 0.450–4.500)

## 2017-01-13 LAB — THYROID ANTIBODIES
Thyroglobulin Antibody: <1 IU/mL (ref 0.0–0.9)
Thyroperoxidase Ab SerPl-aCnc: 25 IU/mL (ref 0–34)

## 2017-01-13 NOTE — Telephone Encounter (Signed)
Pt is calling to see if someone could give her her lab results from her last visit.  She is concerned about her thyroid levels and is wanting to get an idea of her levels before her appt if possible.  Please advise  [phone]

## 2017-01-13 NOTE — Progress Notes (Signed)
8/20/20187:52 PM  Megan Cook 11/11/1965, 51 y.o. female [id number]  Chief Complaint  Patient presents with  . Labs Only    Check thyroid. Patient has lab results with her from [facility], dated 12/30/16. Concerned of thyroid  . Other    Skin issues, hair loss.     HPI:   Patient is a 51 y.o. female with past medical history significant for lipedema and hashimoto's hypothyroidism who presents today for concerns of needing to get back on medication.  Patient reports history of long standing treatment with thyroid replacement, most recently nature-throid 81.25mg . Patient had been seeing an endocrinologist within the [facility] system and is unable to afford seeing her anymore. She reports being off nature-throid for about 9 months, it started with the nation wide shortage, had side effects with armour thyroid and never went back to endo per above. She reports that she has also been putting it off as she recently had knee surgery and has been recovering well from that. She reports endo wise she has noticed significant drying, cracking of her skin, she reports bleeding and pain. She also reports dry brittle hair with more hair loss than normal for her. She reports her energy level being down, despite her post-surgical recovery going really well.   She did reach out to her endocrinologist to have labs done. She brings in today her lab report.  [facility] 12/30/16 TSH 4.020 T4 6.9 T3 120  Depression screen [facility] 2/9 01/10/2017 06/14/2016 05/10/2015  Decreased Interest 0 0 0  Down, Depressed, Hopeless 0 0 0  PHQ - 2 Score 0 0 0    Allergies  Allergen Reactions  . Carbocaine [Mepivacaine Hcl]     Muscle pain and swelling.  . Codeine Itching and Nausea Only    Current Outpatient Prescriptions on File Prior to Visit  Medication Sig Dispense Refill  . Multiple Vitamin (MULTIVITAMIN) tablet Take 1 tablet by mouth daily.     No current facility-administered medications on file prior to visit.      Past Medical History:  Diagnosis Date  . Depression   . Hashimoto's disease   . Hashimoto's disease   . Heart murmur   . Lymph edema   . 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 Thyroid disease     Past Surgical History:  Procedure Laterality Date  . CESAREAN SECTION    . left knee    . MEDIAL PARTIAL KNEE REPLACEMENT Left   . neck fusion    . TONSILLECTOMY    . TUBAL LIGATION      Social History  Substance Use Topics  . Smoking status: Never Smoker  . Smokeless tobacco: Never Used  . Alcohol use No    Family History  Problem Relation Age of Onset  . Stroke Mother   . Thyroid disease Mother   . Cancer Father   . Colon cancer Father   . Thyroid disease Sister   . Thyroid disease Sister     Review of Systems  Constitutional: Negative for chills and fever.  Respiratory: Negative for cough and shortness of breath.   Cardiovascular: Negative for chest pain, palpitations and leg swelling.  Gastrointestinal: Negative for abdominal pain, nausea and vomiting.     OBJECTIVE:  Blood pressure 134/82, pulse 78, temperature 98.9 F (37.2 C), temperature source Oral, resp. rate 16, height 5' 6.5" (1.689 m), weight 218 lb (98.9 kg), last menstrual period 07/13/2011, SpO2 95 %.  Physical  Exam  Constitutional: She is well-developed, well-nourished, and in no distress.  HENT:  Head: Normocephalic and atraumatic.  Mouth/Throat: Oropharynx is clear and moist.  Eyes: Pupils are equal, round, and reactive to light. Conjunctivae and EOM are normal.  Neck: Neck supple. No thyroid mass and no thyromegaly present.  Cardiovascular: Normal rate, regular rhythm and normal heart sounds.  Exam reveals no gallop and no friction rub.   No murmur heard. Pulmonary/Chest: Breath sounds normal. She has no wheezes. She has no rales.  Musculoskeletal: She exhibits no edema.  Lymphadenopathy:    She has no cervical adenopathy.  Skin: Skin is warm and dry.     Results for orders placed or performed in visit on 01/10/17  TSH  Result Value Ref Range   TSH 4.470 0.450 - 4.500 uIU/mL  T4, Free  Result Value Ref Range   Free T4 1.12 0.82 - 1.77 ng/dL  T3, Free  Result Value Ref Range   T3, Free 3.0 2.0 - 4.4 pg/mL  Thyroid antibodies  Result Value Ref Range   Thyroperoxidase Ab SerPl-aCnc 25 0 - 34 IU/mL   Thyroglobulin Antibody <1.0 0.0 - 0.9 IU/mL     ASSESSMENT and PLAN:  1. Hypothyroidism due to Hashimoto's thyroiditis Patient with normal labs done earlier this month but reports to be symptomatic. Will repeat labs with antibody levels.  Initiation of treatment pending lab results.   - TSH - T4, Free - T3, Free - Thyroid antibodies       Myles Lipps, MD Primary Care at [facility] [address] [place], [place] [address] Ph.  [phone] Fax [phone]

## 2017-01-14 NOTE — Telephone Encounter (Signed)
Spoke with patient and gave her the lab results. Advised the patient that based on your note everything was within normal range and patient stated that her thyroid isn't normal and she gets aggravated when she receives call stating that its normal. Advised patient that I would put in a message to the provider in regards to her concerns and stated that she would like to start a medication since her symptoms are not consistent with a normal thyroid. Please advise

## 2017-01-14 NOTE — Telephone Encounter (Signed)
Called patient no answer, left message stating I would try to call again on thursday

## 2017-01-17 ENCOUNTER — Ambulatory Visit (INDEPENDENT_AMBULATORY_CARE_PROVIDER_SITE_OTHER): Payer: BLUE CROSS/BLUE SHIELD | Admitting: Family Medicine

## 2017-01-17 ENCOUNTER — Encounter: Payer: Self-pay | Admitting: Family Medicine

## 2017-01-17 VITALS — BP 126/80 | HR 82 | Temp 98.9°F | Resp 16 | Ht 66.0 in | Wt 215.0 lb

## 2017-01-17 DIAGNOSIS — E063 Autoimmune thyroiditis: Secondary | ICD-10-CM | POA: Diagnosis not present

## 2017-01-17 DIAGNOSIS — E038 Other specified hypothyroidism: Secondary | ICD-10-CM

## 2017-01-17 DIAGNOSIS — L300 Nummular dermatitis: Secondary | ICD-10-CM | POA: Diagnosis not present

## 2017-01-17 MED ORDER — BETAMETHASONE DIPROPIONATE 0.05 % EX CREA: TOPICAL_CREAM | Freq: Two times a day (BID) | CUTANEOUS | 3 refills | Status: DC

## 2017-01-17 NOTE — Progress Notes (Signed)
8/24/20186:07 PM  PAULI KLUNDT Feb 15, 1966, 51 y.o. female [id number]  Chief Complaint  Patient presents with  . Follow-up    thyroid and labwork    HPI:   Patient is a 51 y.o. female who presents today for fu on thyroid labs. She was on biotin at time labs were drawn. Has stopped biotin this past week. H/o hypothyroidism, used to be on nature-throid. Sx: hair loss, dry cracking skin in hands, rough patches on legs, mental fogginess.  Depression screen [facility] 2/9 01/17/2017 01/10/2017 06/14/2016  Decreased Interest 0 0 0  Down, Depressed, Hopeless 0 0 0  PHQ - 2 Score 0 0 0    Allergies  Allergen Reactions  . Carbocaine [Mepivacaine Hcl]     Muscle pain and swelling.  . Codeine Itching and Nausea Only    Current Outpatient Prescriptions on File Prior to Visit  Medication Sig Dispense Refill  . Multiple Vitamin (MULTIVITAMIN) tablet Take 1 tablet by mouth daily.     No current facility-administered medications on file prior to visit.     Past Medical History:  Diagnosis Date  . Depression   . Hashimoto's disease   . Hashimoto's disease   . Heart murmur   . Lymph edema   . 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 Thyroid disease     Past Surgical History:  Procedure Laterality Date  . CESAREAN SECTION    . left knee    . MEDIAL PARTIAL KNEE REPLACEMENT Left   . neck fusion    . TONSILLECTOMY    . TUBAL LIGATION      Social History  Substance Use Topics  . Smoking status: Never Smoker  . Smokeless tobacco: Never Used  . Alcohol use No    Family History  Problem Relation Age of Onset  . Stroke Mother   . Thyroid disease Mother   . Cancer Father   . Colon cancer Father   . Thyroid disease Sister   . Thyroid disease Sister     Review of Systems  Constitutional: Negative for chills and fever.  Respiratory: Negative for cough and shortness of breath.   Cardiovascular: Negative for chest pain, palpitations and leg  swelling.  Gastrointestinal: Negative for abdominal pain, nausea and vomiting.     OBJECTIVE:  Blood pressure 126/80, pulse 82, temperature 98.9 F (37.2 C), temperature source Oral, resp. rate 16, height 5\' 6"  (1.676 m), weight 215 lb (97.5 kg), last menstrual period 07/13/2011, SpO2 96 %.  Physical Exam  Constitutional: She is oriented to person, place, and time and well-developed, well-nourished, and in no distress.  HENT:  Head: Normocephalic and atraumatic.  Eyes: Pupils are equal, round, and reactive to light. EOM are normal.  Pulmonary/Chest: Effort normal.  Neurological: She is alert and oriented to person, place, and time.  Skin: Skin is warm and dry. Rash (bilateral palmar surface with dry scaly skin and blisters, shins bilateral legs with round erythematous patches) noted.    Results for orders placed or performed in visit on 01/10/17  TSH  Result Value Ref Range   TSH 4.470 0.450 - 4.500 uIU/mL  T4, Free  Result Value Ref Range   Free T4 1.12 0.82 - 1.77 ng/dL  T3, Free  Result Value Ref Range   T3, Free 3.0 2.0 - 4.4 pg/mL  Thyroid antibodies  Result Value Ref Range   Thyroperoxidase Ab SerPl-aCnc 25 0 - 34 IU/mL   Thyroglobulin Antibody <1.0 0.0 -  0.9 IU/mL     ASSESSMENT and PLAN:  1. Hypothyroidism due to Hashimoto's thyroiditis Had long discussion regarding labs and symptoms, might have been affected by biotin, repeating again, consider endo referral. - TSH; Future - T4, Free; Future - T3, Free; Future - Thyroid antibodies; Future  2. Nummular eczema Discused diagnosis, also has dishydrotic eczema. Discussed supportive measures. rx for betamethasone renewed, originally rx by derm.       Myles Lipps, MD Primary Care at [facility] [address] [place], [place] [address] Ph.  [phone] Fax [phone]

## 2017-01-17 NOTE — Telephone Encounter (Signed)
Patient seen in clinic today

## 2017-01-17 NOTE — Patient Instructions (Addendum)
     IF you received an x-ray today, you will receive an invoice from [facility]. Please contact [facility] at [phone] with questions or concerns regarding your invoice.   IF you received labwork today, you will receive an invoice from [facility]. Please contact [facility] at [phone] with questions or concerns regarding your invoice.   Our billing staff will not be able to assist you with questions regarding bills from these companies.  You will be contacted with the lab results as soon as they are available. The fastest way to get your results is to activate your My Chart account. Instructions are located on the last page of this paperwork. If you have not heard from us regarding the results in 2 weeks, please contact this office.     Hand Dermatitis Hand dermatitis is a skin condition that causes small, itchy, raised dots or fluid-filled blisters to form over the palms of the hands. This condition may also be called hand eczema. What are the causes? The cause of this condition is not known. What increases the risk? This condition is more likely to develop in people who have a history of allergies, such as:  Hay fever.  Allergic asthma.  An allergy to latex.  Chemical exposure, injuries, and environmental irritants can make hand dermatitis worse. Washing your hands too often can remove natural oils, which can dry out the skin and contribute to outbreaks of this condition. What are the signs or symptoms? The most common symptom of this condition is intense itchiness. Cracks or grooves (fissures) on the fingers can also develop. Affected areas can be painful, especially areas where large blisters have formed. How is this diagnosed? This condition is diagnosed with a medical history and physical exam. How is this treated? This condition is treated with medicines, including:  Steroid creams and ointments.  Oral steroid medicines.  Antibiotic medicines.  These are prescribed if you have an infection.  Antihistamine medicines. These help to reduce itchiness.  Follow these instructions at home:  Take or apply over-the-counter and prescription medicines only as told by your health care provider.  If you were prescribed an antibiotic medicine, use it as told by your health care provider. Do not stop using the antibiotic even if you start to feel better.  Avoid washing your hands more often than necessary.  Avoid using harsh chemicals on your hands.  Wear protective gloves when you handle products that can irritate your skin.  Keep all follow-up visits as told by your health care provider. This is important. Contact a health care provider if:  Your rash does not improve during the first week of treatment.  Your rash is red or tender.  Your rash has pus coming from it.  Your rash spreads. This information is not intended to replace advice given to you by your health care provider. Make sure you discuss any questions you have with your health care provider. Document Released: 05/13/2005 Document Revised: 10/19/2015 Document Reviewed: 11/25/2014 Elsevier Interactive Patient Education  2018 [employer] Inc[employer]  

## 2017-01-31 ENCOUNTER — Ambulatory Visit: Payer: BLUE CROSS/BLUE SHIELD | Admitting: Family Medicine

## 2017-02-03 DIAGNOSIS — M1712 Unilateral primary osteoarthritis, left knee: Secondary | ICD-10-CM | POA: Diagnosis not present

## 2017-02-03 DIAGNOSIS — Z96652 Presence of left artificial knee joint: Secondary | ICD-10-CM | POA: Diagnosis not present

## 2017-02-17 DIAGNOSIS — I890 Lymphedema, not elsewhere classified: Secondary | ICD-10-CM | POA: Diagnosis not present

## 2017-02-17 DIAGNOSIS — Z4789 Encounter for other orthopedic aftercare: Secondary | ICD-10-CM | POA: Diagnosis not present

## 2017-03-07 DIAGNOSIS — L308 Other specified dermatitis: Secondary | ICD-10-CM | POA: Diagnosis not present

## 2017-03-10 DIAGNOSIS — F900 Attention-deficit hyperactivity disorder, predominantly inattentive type: Secondary | ICD-10-CM | POA: Diagnosis not present

## 2017-04-07 DIAGNOSIS — L308 Other specified dermatitis: Secondary | ICD-10-CM | POA: Diagnosis not present

## 2017-04-07 DIAGNOSIS — L0109 Other impetigo: Secondary | ICD-10-CM | POA: Diagnosis not present

## 2017-04-07 DIAGNOSIS — L821 Other seborrheic keratosis: Secondary | ICD-10-CM | POA: Diagnosis not present

## 2017-04-07 DIAGNOSIS — L0889 Other specified local infections of the skin and subcutaneous tissue: Secondary | ICD-10-CM | POA: Diagnosis not present

## 2017-05-13 DIAGNOSIS — L308 Other specified dermatitis: Secondary | ICD-10-CM | POA: Diagnosis not present

## 2017-05-21 DIAGNOSIS — Z6834 Body mass index (BMI) 34.0-34.9, adult: Secondary | ICD-10-CM | POA: Diagnosis not present

## 2017-05-21 DIAGNOSIS — Z780 Asymptomatic menopausal state: Secondary | ICD-10-CM | POA: Diagnosis not present

## 2017-05-21 DIAGNOSIS — Z1231 Encounter for screening mammogram for malignant neoplasm of breast: Secondary | ICD-10-CM | POA: Diagnosis not present

## 2017-05-21 DIAGNOSIS — Z1329 Encounter for screening for other suspected endocrine disorder: Secondary | ICD-10-CM | POA: Diagnosis not present

## 2017-05-21 DIAGNOSIS — Z01419 Encounter for gynecological examination (general) (routine) without abnormal findings: Secondary | ICD-10-CM | POA: Diagnosis not present

## 2017-06-02 DIAGNOSIS — M25562 Pain in left knee: Secondary | ICD-10-CM | POA: Diagnosis not present

## 2017-06-02 DIAGNOSIS — M6258 Muscle wasting and atrophy, not elsewhere classified, other site: Secondary | ICD-10-CM | POA: Diagnosis not present

## 2017-07-17 DIAGNOSIS — L2089 Other atopic dermatitis: Secondary | ICD-10-CM | POA: Diagnosis not present

## 2017-07-28 DIAGNOSIS — N941 Unspecified dyspareunia: Secondary | ICD-10-CM | POA: Diagnosis not present

## 2017-07-28 DIAGNOSIS — E039 Hypothyroidism, unspecified: Secondary | ICD-10-CM | POA: Diagnosis not present

## 2017-07-28 DIAGNOSIS — Z13820 Encounter for screening for osteoporosis: Secondary | ICD-10-CM | POA: Diagnosis not present

## 2017-07-28 DIAGNOSIS — N958 Other specified menopausal and perimenopausal disorders: Secondary | ICD-10-CM | POA: Diagnosis not present

## 2017-07-28 DIAGNOSIS — R6882 Decreased libido: Secondary | ICD-10-CM | POA: Diagnosis not present

## 2017-07-28 DIAGNOSIS — R635 Abnormal weight gain: Secondary | ICD-10-CM | POA: Diagnosis not present

## 2017-08-12 DIAGNOSIS — L2089 Other atopic dermatitis: Secondary | ICD-10-CM | POA: Diagnosis not present

## 2017-08-12 DIAGNOSIS — H903 Sensorineural hearing loss, bilateral: Secondary | ICD-10-CM | POA: Diagnosis not present

## 2017-08-12 DIAGNOSIS — H9313 Tinnitus, bilateral: Secondary | ICD-10-CM | POA: Diagnosis not present

## 2017-08-12 DIAGNOSIS — L308 Other specified dermatitis: Secondary | ICD-10-CM | POA: Diagnosis not present

## 2017-09-05 DIAGNOSIS — F900 Attention-deficit hyperactivity disorder, predominantly inattentive type: Secondary | ICD-10-CM | POA: Diagnosis not present

## 2017-09-13 IMAGING — MR MR KNEE*L* W/O CM
4 of 6 series · 19 of 40 positions shown · non-contrast
Comparison: None.

CLINICAL DATA: Acute on chronic knee pain, acute pain for the past
3 weeks. Meniscal surgery December 2015. Knee catching and pain.
Popping injury while taking a step 3 weeks ago.

EXAM:
MRI OF THE LEFT KNEE WITHOUT CONTRAST
TECHNIQUE: Multiplanar, multisequence MR imaging of the knee was performed. No
intravenous contrast was administered.

[Series 3: pd_tse_fs_tra · axial · 4.0mm · 0.42mm/px · z∈[-74,+32]mm · 3 of 31 slices shown]
[im 4/31]
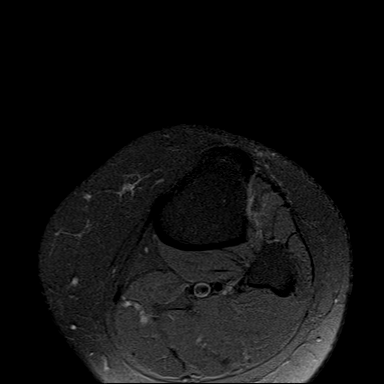
[im 17/31]
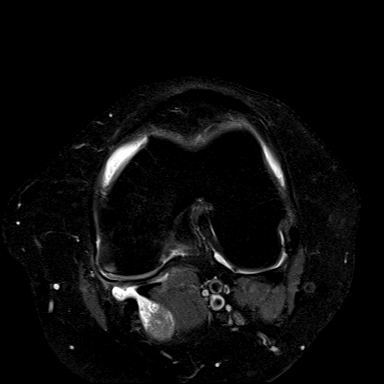
[im 27/31]
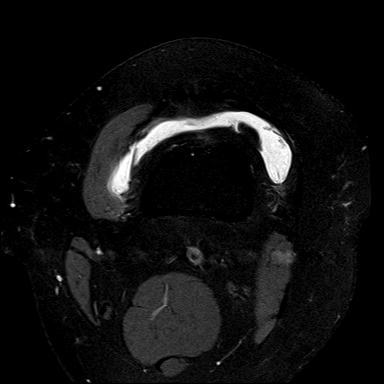

[Series 5: T2 fat-sat · coronal · 3.2mm · 0.62mm/px · 7 of 26 slices shown]
[im 1/26]
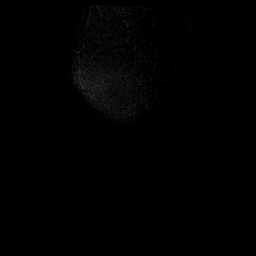
[im 5/26]
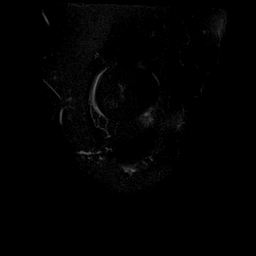
[im 9/26]
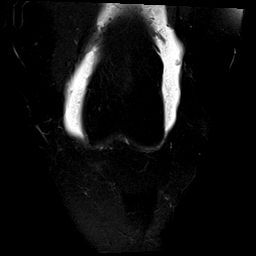
[im 13/26]
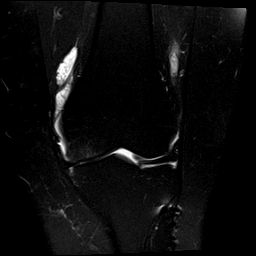
[im 17/26]
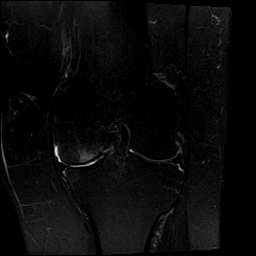
[im 21/26]
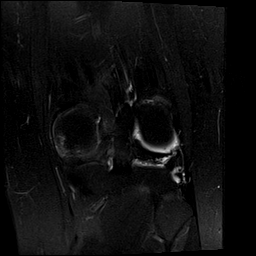
[im 26/26]
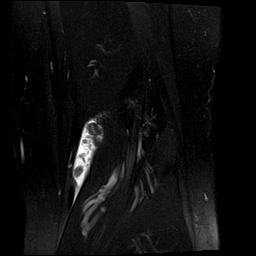

[Series 6: T1 · coronal · 3.2mm · 0.27mm/px · 3 of 26 slices shown]
[im 5/26]
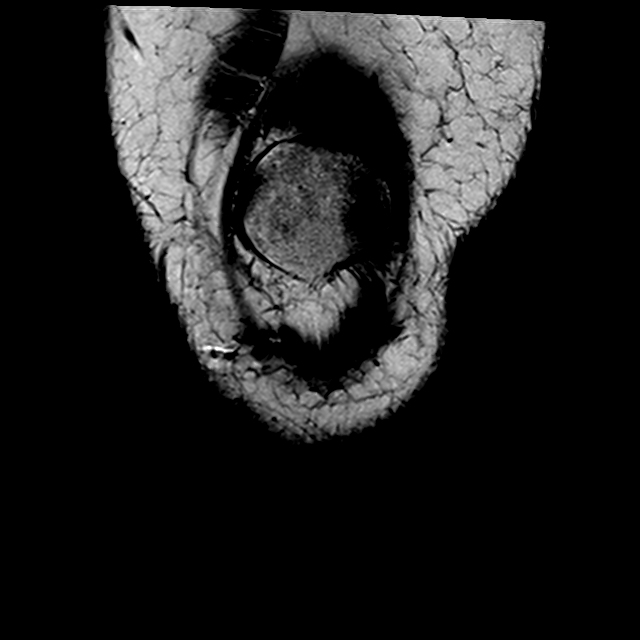
[im 13/26]
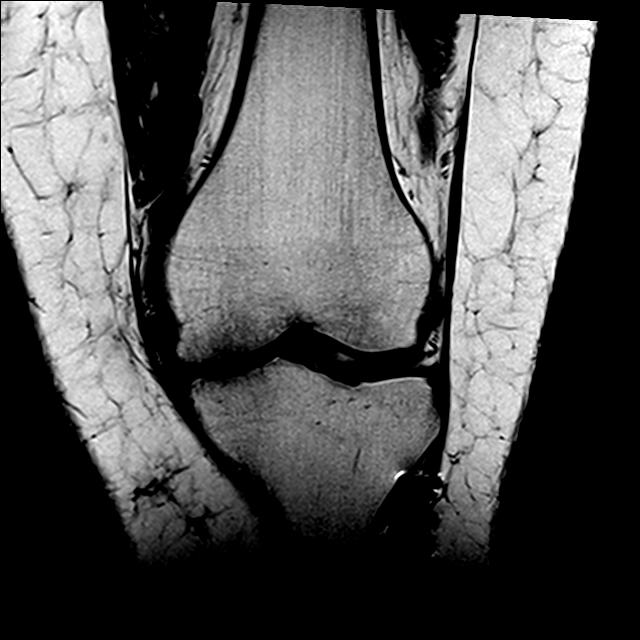
[im 21/26]
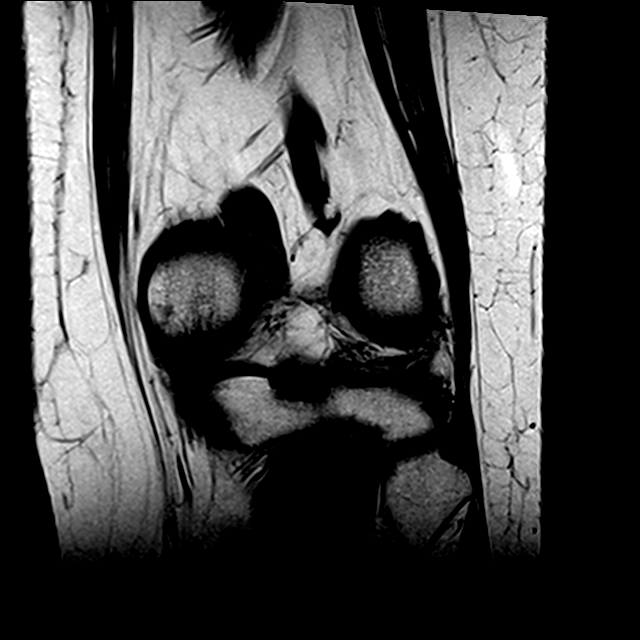

[Series 7: PD fat-sat · sagittal · 3.5mm · 0.27mm/px · 6 of 23 slices shown]
[im 1/23]
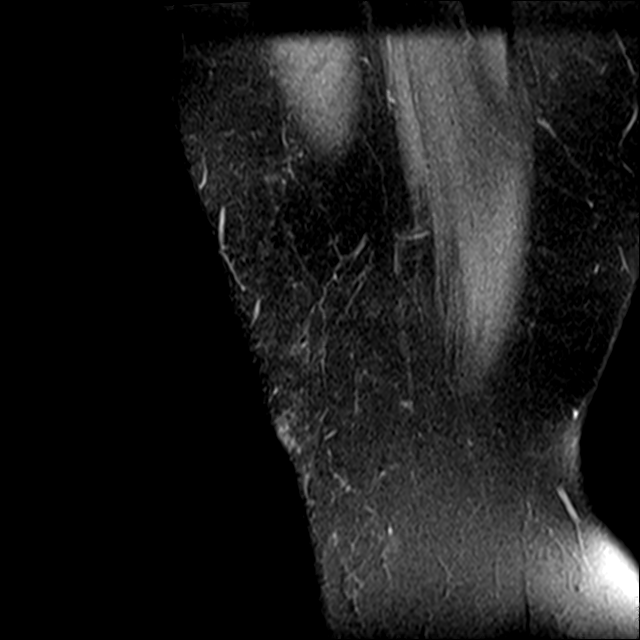
[im 5/23]
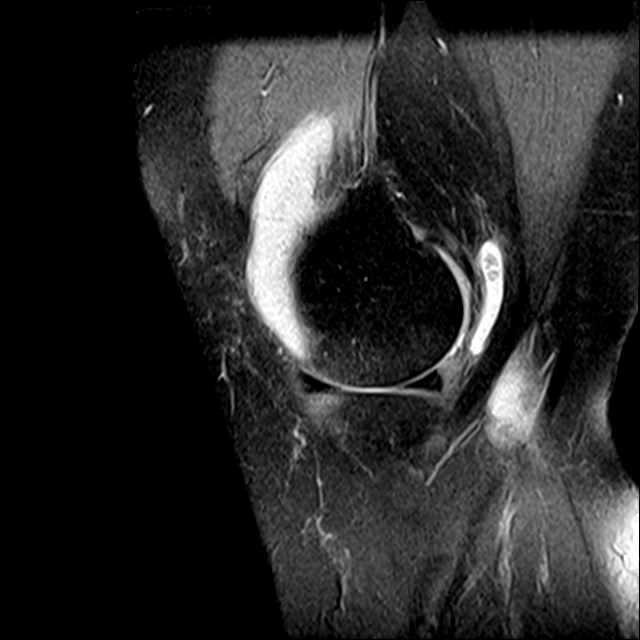
[im 9/23]
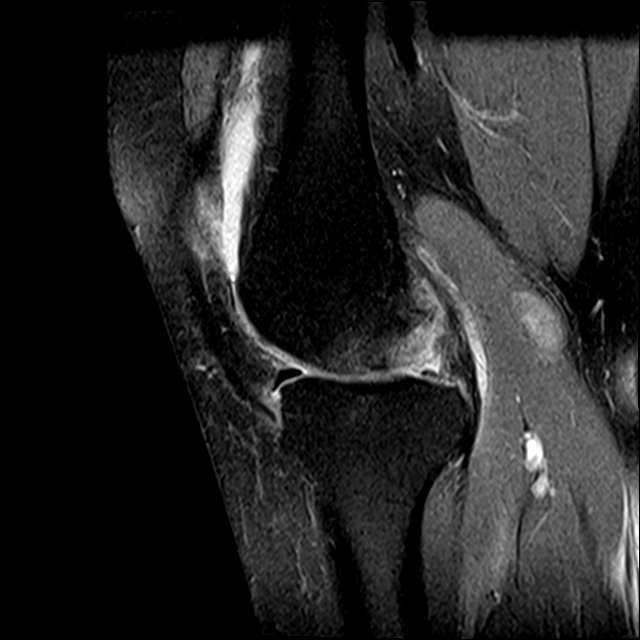
[im 14/23]
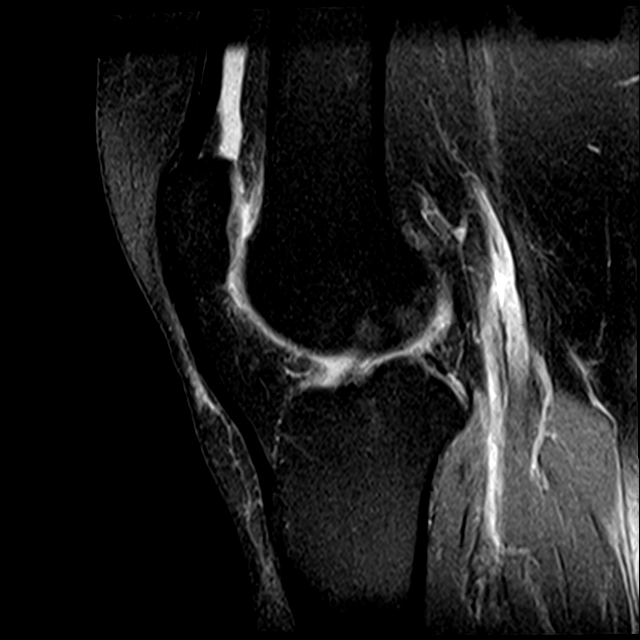
[im 18/23]
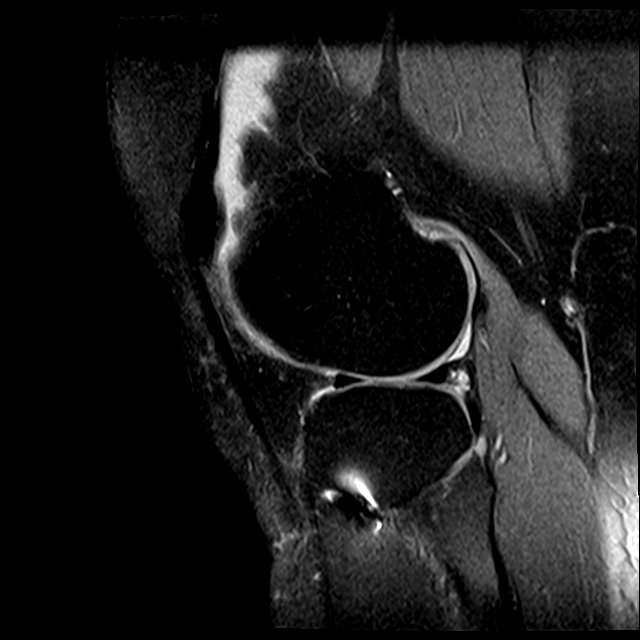
[im 23/23]
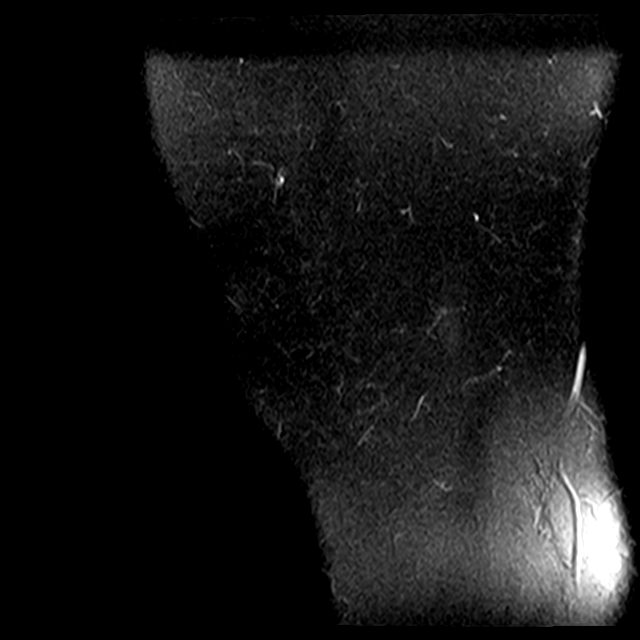

[19 of 40 positions shown; findings below may reference images not displayed]

FINDINGS: MENISCI

Medial meniscus: The 2 cm segment of meniscus extending from the
meniscal root to the posterior horn is very diminutive and somewhat
indistinct as shown on image [DATE]. There is some linear grade 2
signal in the midbody of the medial meniscus.

Lateral meniscus: Mild free edge fraying in the midbody, image [DATE],
without overt tear.

LIGAMENTS

Cruciates:  Unremarkable

Collaterals:  Unremarkable

CARTILAGE

Patellofemoral: Focal osteochondral defect along the posterior
patellar ridge with chondral fissuring and focal chondral edema with
underlying subcortical marrow edema, images 9 through 10 series 3.
Small degenerative subcortical focus of marrow edema inferiorly
along the medial patellar facet with some mild overlying chondral
heterogeneity in the patella. Mild marginal spurring.

Medial: Severe full-thickness degenerative chondral thinning with
subcortical marrow edema especially confluent in the medial femoral
condyle as on image [DATE], and to a lesser degree in the medial
tibial plateau.

Lateral:  Moderate degenerative chondral thinning.

Joint: Moderate knee joint effusion with mild synovitis. Synovitis
or debris in the suprapatellar bursa. Medial infrapatellar
arthroscopy port site noted, image [DATE].

Popliteal Fossa: Baker's cyst measuring 12 cc in volume with
irregular internal filling defects compatible with debris,
synovitis, or blood products.

Extensor Mechanism:  Unremarkable

Bones: Small amount of metal artifact along the anterolateral
proximal tibial metaphysis in the vicinity of the proximal tibialis
anterior, as on image [DATE]. Otherwise, no significant
extra-articular osseous abnormalities identified.

Other: No supplemental non-categorized findings.
IMPRESSION: 1. Knee effusion and moderate sized Baker's cyst with filling
defects favoring synovitis, less likely debris or blood products.
2. Full-thickness loss of articular cartilage in most of the medial
compartment with subcortical marrow edema especially along the
medial femoral condyle. There is moderate degenerative chondral
thinning in the lateral compartment, and a focal small osteochondral
defect along the posterior patellar ridge.
3. The 2 cm segment of meniscus extending from the medial meniscal
root to the posterior horn is very diminutive in indistinct.
Although probably from partial meniscectomy, degenerative tearing in
this vicinity is not entirely excluded.
4. Mild free edge fraying of the midbody lateral meniscus without
overt lateral meniscal tear identified.

## 2017-10-08 ENCOUNTER — Other Ambulatory Visit: Payer: Self-pay

## 2017-10-08 ENCOUNTER — Ambulatory Visit (INDEPENDENT_AMBULATORY_CARE_PROVIDER_SITE_OTHER): Payer: BLUE CROSS/BLUE SHIELD | Admitting: Family Medicine

## 2017-10-08 ENCOUNTER — Encounter: Payer: Self-pay | Admitting: Family Medicine

## 2017-10-08 ENCOUNTER — Ambulatory Visit (INDEPENDENT_AMBULATORY_CARE_PROVIDER_SITE_OTHER): Payer: BLUE CROSS/BLUE SHIELD

## 2017-10-08 VITALS — BP 122/82 | HR 92 | Temp 99.0°F | Resp 18 | Ht 66.54 in | Wt 222.4 lb

## 2017-10-08 DIAGNOSIS — I890 Lymphedema, not elsewhere classified: Secondary | ICD-10-CM

## 2017-10-08 DIAGNOSIS — Z114 Encounter for screening for human immunodeficiency virus [HIV]: Secondary | ICD-10-CM

## 2017-10-08 DIAGNOSIS — Z1159 Encounter for screening for other viral diseases: Secondary | ICD-10-CM

## 2017-10-08 DIAGNOSIS — Z01818 Encounter for other preprocedural examination: Secondary | ICD-10-CM

## 2017-10-08 DIAGNOSIS — Z23 Encounter for immunization: Secondary | ICD-10-CM

## 2017-10-08 DIAGNOSIS — E063 Autoimmune thyroiditis: Secondary | ICD-10-CM

## 2017-10-08 DIAGNOSIS — E038 Other specified hypothyroidism: Secondary | ICD-10-CM | POA: Diagnosis not present

## 2017-10-08 DIAGNOSIS — R918 Other nonspecific abnormal finding of lung field: Secondary | ICD-10-CM | POA: Diagnosis not present

## 2017-10-08 LAB — POCT URINALYSIS DIP (MANUAL ENTRY)
BILIRUBIN UA: NEGATIVE
BILIRUBIN UA: NEGATIVE mg/dL
Blood, UA: NEGATIVE
GLUCOSE UA: NEGATIVE mg/dL
LEUKOCYTES UA: NEGATIVE
Nitrite, UA: NEGATIVE
PH UA: 6.5 (ref 5.0–8.0)
Protein Ur, POC: NEGATIVE mg/dL
Spec Grav, UA: <=1.005 — AB (ref 1.010–1.025)
Urobilinogen, UA: 0.2 E.U./dL

## 2017-10-08 LAB — POCT URINE PREGNANCY: Preg Test, Ur: NEGATIVE

## 2017-10-08 NOTE — Progress Notes (Signed)
Subjective:    Patient ID: Megan Cook, female    DOB: March 22, 1966, 52 y.o.   MRN: [id number]  10/08/2017  Medical Clearance    HPI This 52 y.o. female presents for evaluation of PRESURGICAL CLEARANCE.  Will have lymphatic massage and will lose 15 pounds.   Having two previous surgeries, doing much better. Undergoing abdominal and lower back surgery.  Now that abdominal region has worsened, now legs have worsened. Horrible disease.    Exercises five days per week.   Ride exercise bike and does 45 minutes on hill incline.  Also does 15 minutes on elliptical.  Must be really careful with weights; can cause worsening inflammation; low weights 3 times per week in legs and arms.  Can lead to lymphedema which is very scary. [employer] appeal.  Had to pay.  No chest pain, palpitations, SOB, orthopnea.   No snoring.  No apnea.   Traveling to [place] to [place]; sister going with patient.   Travel for two weeks.  Also doing upper extremity work as well.    BP Readings from Last 3 Encounters:  10/08/17 122/82  01/17/17 126/80  01/10/17 134/82   Wt Readings from Last 3 Encounters:  10/08/17 222 lb 6.4 oz (100.9 kg)  01/17/17 215 lb (97.5 kg)  01/10/17 218 lb (98.9 kg)   Immunization History  Administered Date(s) Administered  . MMR 10/08/2017  . Tdap 06/28/2012    Review of Systems  Constitutional: Negative for activity change, appetite change, chills, diaphoresis, fatigue, fever and unexpected weight change.  HENT: Negative for congestion, dental problem, drooling, ear discharge, ear pain, facial swelling, hearing loss, mouth sores, nosebleeds, postnasal drip, rhinorrhea, sinus pressure, sneezing, sore throat, tinnitus, trouble swallowing and voice change.   Eyes: Negative for photophobia, pain, discharge, redness, itching and visual disturbance.  Respiratory: Negative for apnea, cough, choking, chest tightness, shortness of breath, wheezing and stridor.   Cardiovascular:  Negative for chest pain, palpitations and leg swelling.  Gastrointestinal: Negative for abdominal distention, abdominal pain, anal bleeding, blood in stool, constipation, diarrhea, nausea, rectal pain and vomiting.  Endocrine: Negative for cold intolerance, heat intolerance, polydipsia, polyphagia and polyuria.  Genitourinary: Negative for decreased urine volume, difficulty urinating, dyspareunia, dysuria, enuresis, flank pain, frequency, genital sores, hematuria, menstrual problem, pelvic pain, urgency, vaginal bleeding, vaginal discharge and vaginal pain.  Musculoskeletal: Negative for arthralgias, back pain, gait problem, joint swelling, myalgias, neck pain and neck stiffness.  Skin: Negative for color change, pallor, rash and wound.  Allergic/Immunologic: Negative for environmental allergies, food allergies and immunocompromised state.  Neurological: Negative for dizziness, tremors, seizures, syncope, facial asymmetry, speech difficulty, weakness, light-headedness, numbness and headaches.  Hematological: Negative for adenopathy. Does not bruise/bleed easily.  Psychiatric/Behavioral: Negative for agitation, behavioral problems, confusion, decreased concentration, dysphoric mood, hallucinations, self-injury, sleep disturbance and suicidal ideas. The patient is not nervous/anxious and is not hyperactive.     Past Medical History:  Diagnosis Date  . Depression   . Heart murmur   . Hypothyroidism due to Hashimoto's thyroiditis   . Lymph edema   . Substance abuse ([place]) 03/07/09   Clean since 03/07/2009 and in recovery   Past Surgical History:  Procedure Laterality Date  . CESAREAN SECTION    . left knee    . MEDIAL PARTIAL KNEE REPLACEMENT Left   . neck fusion    . TONSILLECTOMY    . TUBAL LIGATION     Allergies  Allergen Reactions  . Methylisothiazolinone Rash    Causes eczema   .  Carbocaine [Mepivacaine Hcl]     Muscle pain and swelling.  . Codeine Itching and Nausea Only    Current Outpatient Medications on File Prior to Visit  Medication Sig Dispense Refill  . betamethasone dipropionate (DIPROLENE) 0.05 % cream Apply topically 2 (two) times daily. 45 g 3  . Multiple Vitamin (MULTIVITAMIN) tablet Take 1 tablet by mouth daily.     No current facility-administered medications on file prior to visit.    Social History   Socioeconomic History  . Marital status: Married    Spouse name: Not on file  . Number of children: Not on file  . Years of education: Not on file  . Highest education level: Not on file  Occupational History  . Not on file  Social Needs  . Financial resource strain: Not on file  . Food insecurity:    Worry: Not on file    Inability: Not on file  . Transportation needs:    Medical: Not on file    Non-medical: Not on file  Tobacco Use  . Smoking status: Never Smoker  . Smokeless tobacco: Never Used  Substance and Sexual Activity  . Alcohol use: No    Alcohol/week: 0.0 oz  . Drug use: No  . Sexual activity: Yes    Birth control/protection: Surgical  Lifestyle  . Physical activity:    Days per week: Not on file    Minutes per session: Not on file  . Stress: Not on file  Relationships  . Social connections:    Talks on phone: Not on file    Gets together: Not on file    Attends religious service: Not on file    Active member of club or organization: Not on file    Attends meetings of clubs or organizations: Not on file    Relationship status: Not on file  . Intimate partner violence:    Fear of current or ex partner: Not on file    Emotionally abused: Not on file    Physically abused: Not on file    Forced sexual activity: Not on file  Other Topics Concern  . Not on file  Social History Narrative  . Not on file   Family History  Problem Relation Age of Onset  . Stroke Mother   . Thyroid disease Mother   . Cancer Father 28       Hairy Cell Leukemia  . Colon cancer Father 62  . Thyroid disease Sister   .  Thyroid disease Sister        Objective:    BP 122/82   Pulse 92   Temp 99 F (37.2 C) (Oral)   Resp 18   Ht 5' 6.54" (1.69 m)   Wt 222 lb 6.4 oz (100.9 kg)   LMP 07/13/2011   SpO2 97%   BMI 35.32 kg/m  Physical Exam  Constitutional: She is oriented to person, place, and time. She appears well-developed and well-nourished. No distress.  HENT:  Head: Normocephalic and atraumatic.  Right Ear: External ear normal.  Left Ear: External ear normal.  Nose: Nose normal.  Mouth/Throat: Oropharynx is clear and moist.  Eyes: Pupils are equal, round, and reactive to light. Conjunctivae and EOM are normal.  Neck: Normal range of motion and full passive range of motion without pain. Neck supple. No JVD present. Carotid bruit is not present. No thyromegaly present.  Cardiovascular: Normal rate, regular rhythm and normal heart sounds. Exam reveals no gallop and no friction rub.  No murmur heard. Pulmonary/Chest: Effort normal and breath sounds normal. She has no wheezes. She has no rales.  Abdominal: Soft. Bowel sounds are normal. She exhibits no distension and no mass. There is no tenderness. There is no rebound and no guarding.  Musculoskeletal:       Right shoulder: Normal.       Left shoulder: Normal.       Cervical back: Normal.  Lymphadenopathy:    She has no cervical adenopathy.  Neurological: She is alert and oriented to person, place, and time. She has normal reflexes. No cranial nerve deficit. She exhibits normal muscle tone. Coordination normal.  Skin: Skin is warm and dry. No rash noted. She is not diaphoretic. No erythema. No pallor.  Psychiatric: She has a normal mood and affect. Her behavior is normal. Judgment and thought content normal.  Nursing note and vitals reviewed.  No results found. Depression screen [facility] 2/9 10/08/2017 01/17/2017 01/10/2017 06/14/2016 05/10/2015  Decreased Interest 0 0 0 0 0  Down, Depressed, Hopeless 0 0 0 0 0  PHQ - 2 Score 0 0 0 0 0   Fall Risk   10/08/2017 01/17/2017 01/10/2017 06/14/2016  Falls in the past year? No No No No        Assessment & Plan:   1. Hypothyroidism due to Hashimoto's thyroiditis   2. Lymphedema   3. Preoperative clearance   4. Screening for HIV (human immunodeficiency virus)   5. Need for hepatitis B screening test   6. Need for hepatitis C screening test   7. Need for measles-mumps-rubella (MMR) vaccine    Medical clearance for lymphedema surgery; asymptomatic with regular daily exercise; EKG stable; obtain labs and CXR.  Lympedema: chronic; scheduled for surgical revision in upcoming month.  Hypothyroidism: stable; obtain labs; continue current medication.   Need for MMR: s/p MMR booster due to upcoming travel.   Orders Placed This Encounter  Procedures  . DG Chest 2 View    Standing Status:   Future    Number of Occurrences:   1    Standing Expiration Date:   10/08/2018    Order Specific Question:   Reason for Exam (SYMPTOM  OR DIAGNOSIS REQUIRED)    Answer:   presurgical clearance    Order Specific Question:   Is the patient pregnant?    Answer:   No    Order Specific Question:   Preferred imaging location?    Answer:   External  . MMR vaccine subcutaneous  . CBC with Differential/Platelet  . Comprehensive metabolic panel  . TSH  . T4, free  . Protime-INR  . APTT  . HIV antibody  . Hepatitis B surface antigen  . Hepatitis C antibody  . POCT urinalysis dipstick  . POCT urine pregnancy  . EKG 12-Lead   No orders of the defined types were placed in this encounter.   No follow-ups on file.   Babe Clenney Elayne Guerin, M.D. Primary Care at [facility] previously [facility] [place] [address] [place], [place]  [address] [phone] phone [phone] fax

## 2017-10-08 NOTE — Patient Instructions (Signed)
     IF you received an x-ray today, you will receive an invoice from [facility]. Please contact [facility] at [phone] with questions or concerns regarding your invoice.   IF you received labwork today, you will receive an invoice from [facility]. Please contact [facility] at [phone] with questions or concerns regarding your invoice.   Our billing staff will not be able to assist you with questions regarding bills from these companies.  You will be contacted with the lab results as soon as they are available. The fastest way to get your results is to activate your My Chart account. Instructions are located on the last page of this paperwork. If you have not heard from us regarding the results in 2 weeks, please contact this office.     

## 2017-10-09 LAB — PROTIME-INR
INR: 1 (ref 0.8–1.2)
Prothrombin Time: 10.3 s (ref 9.1–12.0)

## 2017-10-09 LAB — CBC WITH DIFFERENTIAL/PLATELET
BASOS: 1 %
Basophils Absolute: 0 10*3/uL (ref 0.0–0.2)
EOS (ABSOLUTE): 0.1 10*3/uL (ref 0.0–0.4)
EOS: 2 %
HEMATOCRIT: 40.3 % (ref 34.0–46.6)
Hemoglobin: 13.5 g/dL (ref 11.1–15.9)
Immature Grans (Abs): 0 10*3/uL (ref 0.0–0.1)
Immature Granulocytes: 0 %
LYMPHS ABS: 2.1 10*3/uL (ref 0.7–3.1)
Lymphs: 31 %
MCH: 29 pg (ref 26.6–33.0)
MCHC: 33.5 g/dL (ref 31.5–35.7)
MCV: 87 fL (ref 79–97)
MONOS ABS: 0.4 10*3/uL (ref 0.1–0.9)
Monocytes: 6 %
Neutrophils Absolute: 4.1 10*3/uL (ref 1.4–7.0)
Neutrophils: 60 %
Platelets: 364 10*3/uL (ref 150–379)
RBC: 4.65 x10E6/uL (ref 3.77–5.28)
RDW: 13.7 % (ref 12.3–15.4)
WBC: 6.7 10*3/uL (ref 3.4–10.8)

## 2017-10-09 LAB — COMPREHENSIVE METABOLIC PANEL
ALK PHOS: 118 IU/L — AB (ref 39–117)
ALT: 31 IU/L (ref 0–32)
AST: 28 IU/L (ref 0–40)
Albumin/Globulin Ratio: 2 (ref 1.2–2.2)
Albumin: 4.8 g/dL (ref 3.5–5.5)
BUN/Creatinine Ratio: 25 — ABNORMAL HIGH (ref 9–23)
BUN: 21 mg/dL (ref 6–24)
Bilirubin Total: 0.2 mg/dL (ref 0.0–1.2)
CO2: 19 mmol/L — ABNORMAL LOW (ref 20–29)
Calcium: 10.1 mg/dL (ref 8.7–10.2)
Chloride: 101 mmol/L (ref 96–106)
Creatinine, Ser: 0.84 mg/dL (ref 0.57–1.00)
GFR calc Af Amer: 92 mL/min/{1.73_m2} (ref >59)
GFR calc non Af Amer: 80 mL/min/{1.73_m2} (ref >59)
GLOBULIN, TOTAL: 2.4 g/dL (ref 1.5–4.5)
Glucose: 98 mg/dL (ref 65–99)
POTASSIUM: 4.4 mmol/L (ref 3.5–5.2)
SODIUM: 138 mmol/L (ref 134–144)
Total Protein: 7.2 g/dL (ref 6.0–8.5)

## 2017-10-09 LAB — HEPATITIS C ANTIBODY: Hep C Virus Ab: <0.1 s/co ratio (ref 0.0–0.9)

## 2017-10-09 LAB — HIV ANTIBODY (ROUTINE TESTING W REFLEX): HIV SCREEN 4TH GENERATION: NONREACTIVE

## 2017-10-09 LAB — APTT: aPTT: 29 s (ref 24–33)

## 2017-10-09 LAB — HEPATITIS B SURFACE ANTIGEN: Hepatitis B Surface Ag: NEGATIVE

## 2017-10-09 LAB — T4, FREE: FREE T4: 1.04 ng/dL (ref 0.82–1.77)

## 2017-10-09 LAB — TSH: TSH: 4.85 u[IU]/mL — AB (ref 0.450–4.500)

## 2017-10-11 ENCOUNTER — Telehealth: Payer: Self-pay

## 2017-10-11 NOTE — Telephone Encounter (Signed)
Copied from CRM [id number]. Topic: Inquiry >> Oct 10, 2017  5:27 PM Landry Mellow wrote: Reason for CRM: pt is calling about labs for pre -op clearance  Cb is [phone]   Message sent to Dr. Katrinka Blazing for review.   Spoke to pt.  She needs to pick up asap to get to surgeon.

## 2017-10-13 ENCOUNTER — Telehealth: Payer: Self-pay | Admitting: *Deleted

## 2017-10-13 DIAGNOSIS — Z800 Family history of malignant neoplasm of digestive organs: Secondary | ICD-10-CM | POA: Diagnosis not present

## 2017-10-13 DIAGNOSIS — Z1211 Encounter for screening for malignant neoplasm of colon: Secondary | ICD-10-CM | POA: Diagnosis not present

## 2017-10-13 DIAGNOSIS — D126 Benign neoplasm of colon, unspecified: Secondary | ICD-10-CM | POA: Diagnosis not present

## 2017-10-13 NOTE — Telephone Encounter (Signed)
See telephone note dated from today:  "Laurey Arrow, LPN   10/28/52 0:98 PM  Note    Spoke with [place] patient was advised that Dr. Katrinka Blazing will be here Wednesday and to call before she comes to pick up to make sure paperwork is filled out.      "

## 2017-10-13 NOTE — Telephone Encounter (Signed)
Patient calling to check on this request for her surgical clearance form. Patient states that she needs this completed by tomorrow (10/14/17) at the latest. Please advise. CB#: [phone]

## 2017-10-13 NOTE — Telephone Encounter (Signed)
Spoke with [place] patient was advised that Dr. Katrinka Blazing will be here Wednesday and to call before she comes to pick up to make sure paperwork is filled out.

## 2017-10-14 ENCOUNTER — Encounter: Payer: Self-pay | Admitting: Family Medicine

## 2017-10-14 ENCOUNTER — Other Ambulatory Visit: Payer: Self-pay | Admitting: Family Medicine

## 2017-10-14 MED ORDER — LEVOTHYROXINE SODIUM 50 MCG PO TABS: 50.0000 ug | ORAL_TABLET | Freq: Every day | ORAL | 3 refills | Status: AC

## 2017-10-14 NOTE — Progress Notes (Unsigned)
Subjective:    Patient ID: Megan Cook, female    DOB: [birth year], 52 y.o.   MRN: [id number]  10/14/2017  No chief complaint on file.    HPI This 52 y.o. female presents for evaluation of ***. BP Readings from Last 3 Encounters:  10/08/17 122/82  01/17/17 126/80  01/10/17 134/82   Wt Readings from Last 3 Encounters:  10/08/17 222 lb 6.4 oz (100.9 kg)  01/17/17 215 lb (97.5 kg)  01/10/17 218 lb (98.9 kg)   Immunization History  Administered Date(s) Administered  . MMR 10/08/2017  . Tdap 06/28/2012    Review of Systems  Past Medical History:  Diagnosis Date  . Depression   . Heart murmur   . Hypothyroidism due to Hashimoto's thyroiditis   . Lymph edema   . Substance abuse ([place]) 03/07/09   Clean since 03/07/2009 and in recovery   Past Surgical History:  Procedure Laterality Date  . CESAREAN SECTION    . left knee    . MEDIAL PARTIAL KNEE REPLACEMENT Left   . neck fusion    . TONSILLECTOMY    . TUBAL LIGATION     Allergies  Allergen Reactions  . Methylisothiazolinone Rash    Causes eczema   . Carbocaine [Mepivacaine Hcl]     Muscle pain and swelling.  . Codeine Itching and Nausea Only   Current Outpatient Medications on File Prior to Visit  Medication Sig Dispense Refill  . betamethasone dipropionate (DIPROLENE) 0.05 % cream Apply topically 2 (two) times daily. 45 g 3  . Multiple Vitamin (MULTIVITAMIN) tablet Take 1 tablet by mouth daily.    Marland Kitchen SYNTHROID 25 MCG tablet Take 25 mcg by mouth daily.  2   No current facility-administered medications on file prior to visit.    Social History   Socioeconomic History  . Marital status: Married    Spouse name: Not on file  . Number of children: Not on file  . Years of education: Not on file  . Highest education level: Not on file  Occupational History  . Not on file  Social Needs  . Financial resource strain: Not on file  . Food insecurity:    Worry: Not on file    Inability: Not on file  .  Transportation needs:    Medical: Not on file    Non-medical: Not on file  Tobacco Use  . Smoking status: Never Smoker  . Smokeless tobacco: Never Used  Substance and Sexual Activity  . Alcohol use: No    Alcohol/week: 0.0 oz  . Drug use: No  . Sexual activity: Yes    Birth control/protection: Surgical  Lifestyle  . Physical activity:    Days per week: Not on file    Minutes per session: Not on file  . Stress: Not on file  Relationships  . Social connections:    Talks on phone: Not on file    Gets together: Not on file    Attends religious service: Not on file    Active member of club or organization: Not on file    Attends meetings of clubs or organizations: Not on file    Relationship status: Not on file  . Intimate partner violence:    Fear of current or ex partner: Not on file    Emotionally abused: Not on file    Physically abused: Not on file    Forced sexual activity: Not on file  Other Topics Concern  . Not on file  Social  History Narrative  . Not on file   Family History  Problem Relation Age of Onset  . Stroke Mother   . Thyroid disease Mother   . Cancer Father 53       Hairy Cell Leukemia  . Colon cancer Father 27  . Thyroid disease Sister   . Thyroid disease Sister        Objective:    LMP 07/13/2011  Physical Exam No results found. Depression screen [facility] 2/9 10/08/2017 01/17/2017 01/10/2017 06/14/2016 05/10/2015  Decreased Interest 0 0 0 0 0  Down, Depressed, Hopeless 0 0 0 0 0  PHQ - 2 Score 0 0 0 0 0   Fall Risk  10/08/2017 01/17/2017 01/10/2017 06/14/2016  Falls in the past year? No No No No        Assessment & Plan:  No diagnosis found.  ***  No orders of the defined types were placed in this encounter.  No orders of the defined types were placed in this encounter.   No follow-ups on file.   Braycen Burandt Elayne Guerin, M.D. Primary Care at [facility] previously [facility] [place] [address] [place], [place]   [address] [phone] phone [phone] fax

## 2017-10-14 NOTE — Progress Notes (Deleted)
Subjective:    Patient ID: Megan Cook, female    DOB: [birth year], 52 y.o.   MRN: [id number]  10/14/2017  No chief complaint on file.    HPI This 52 y.o. female presents for evaluation of ***. BP Readings from Last 3 Encounters:  10/08/17 122/82  01/17/17 126/80  01/10/17 134/82   Wt Readings from Last 3 Encounters:  10/08/17 222 lb 6.4 oz (100.9 kg)  01/17/17 215 lb (97.5 kg)  01/10/17 218 lb (98.9 kg)   Immunization History  Administered Date(s) Administered  . MMR 10/08/2017  . Tdap 06/28/2012    Review of Systems  Past Medical History:  Diagnosis Date  . Depression   . Heart murmur   . Hypothyroidism due to Hashimoto's thyroiditis   . Lymph edema   . Substance abuse ([place]) 03/07/09   Clean since 03/07/2009 and in recovery   Past Surgical History:  Procedure Laterality Date  . CESAREAN SECTION    . left knee    . MEDIAL PARTIAL KNEE REPLACEMENT Left   . neck fusion    . TONSILLECTOMY    . TUBAL LIGATION     Allergies  Allergen Reactions  . Methylisothiazolinone Rash    Causes eczema   . Carbocaine [Mepivacaine Hcl]     Muscle pain and swelling.  . Codeine Itching and Nausea Only   Current Outpatient Medications on File Prior to Visit  Medication Sig Dispense Refill  . betamethasone dipropionate (DIPROLENE) 0.05 % cream Apply topically 2 (two) times daily. 45 g 3  . Multiple Vitamin (MULTIVITAMIN) tablet Take 1 tablet by mouth daily.    Marland Kitchen SYNTHROID 25 MCG tablet Take 25 mcg by mouth daily.  2   No current facility-administered medications on file prior to visit.    Social History   Socioeconomic History  . Marital status: Married    Spouse name: Not on file  . Number of children: Not on file  . Years of education: Not on file  . Highest education level: Not on file  Occupational History  . Not on file  Social Needs  . Financial resource strain: Not on file  . Food insecurity:    Worry: Not on file    Inability: Not on file  .  Transportation needs:    Medical: Not on file    Non-medical: Not on file  Tobacco Use  . Smoking status: Never Smoker  . Smokeless tobacco: Never Used  Substance and Sexual Activity  . Alcohol use: No    Alcohol/week: 0.0 oz  . Drug use: No  . Sexual activity: Yes    Birth control/protection: Surgical  Lifestyle  . Physical activity:    Days per week: Not on file    Minutes per session: Not on file  . Stress: Not on file  Relationships  . Social connections:    Talks on phone: Not on file    Gets together: Not on file    Attends religious service: Not on file    Active member of club or organization: Not on file    Attends meetings of clubs or organizations: Not on file    Relationship status: Not on file  . Intimate partner violence:    Fear of current or ex partner: Not on file    Emotionally abused: Not on file    Physically abused: Not on file    Forced sexual activity: Not on file  Other Topics Concern  . Not on file  Social  History Narrative  . Not on file   Family History  Problem Relation Age of Onset  . Stroke Mother   . Thyroid disease Mother   . Cancer Father 53       Hairy Cell Leukemia  . Colon cancer Father 27  . Thyroid disease Sister   . Thyroid disease Sister        Objective:    LMP 07/13/2011  Physical Exam No results found. Depression screen [facility] 2/9 10/08/2017 01/17/2017 01/10/2017 06/14/2016 05/10/2015  Decreased Interest 0 0 0 0 0  Down, Depressed, Hopeless 0 0 0 0 0  PHQ - 2 Score 0 0 0 0 0   Fall Risk  10/08/2017 01/17/2017 01/10/2017 06/14/2016  Falls in the past year? No No No No        Assessment & Plan:  No diagnosis found.  ***  No orders of the defined types were placed in this encounter.  No orders of the defined types were placed in this encounter.   No follow-ups on file.   Yanelie Abraha Elayne Guerin, M.D. Primary Care at [facility] previously [facility] [place] [address] [place], [place]   [address] [phone] phone [phone] fax

## 2017-10-15 ENCOUNTER — Telehealth: Payer: Self-pay | Admitting: *Deleted

## 2017-10-15 DIAGNOSIS — D126 Benign neoplasm of colon, unspecified: Secondary | ICD-10-CM | POA: Diagnosis not present

## 2017-10-15 NOTE — Telephone Encounter (Signed)
Please inform patient her paperwork is at [address] ready for pickup.  Everything was also faxed to her surgeon.  Her voicemail was full

## 2017-10-16 NOTE — Telephone Encounter (Signed)
Preoperative clearance forms completed and faxed on 10/15/2017.  Remi Haggard, LPN notified patient.  Patient stop by on 5/22 afternoon to pick up information as well.

## 2017-10-16 NOTE — Telephone Encounter (Signed)
Megan Cook 10/15/2017 11:52 AM  Summary: pt need form filled today    Pt states that the form is a surgical clearance form  And she doesn't see any letters in her [facility]

## 2017-10-17 NOTE — Telephone Encounter (Signed)
Attempted to call pt. Mailbox full. Crm done.

## 2017-10-20 ENCOUNTER — Encounter: Payer: Self-pay | Admitting: Family Medicine

## 2018-03-11 DIAGNOSIS — F988 Other specified behavioral and emotional disorders with onset usually occurring in childhood and adolescence: Secondary | ICD-10-CM

## 2018-03-16 ENCOUNTER — Ambulatory Visit: Payer: Self-pay | Admitting: Psychiatry

## 2018-04-06 ENCOUNTER — Ambulatory Visit: Payer: Self-pay | Admitting: Psychiatry

## 2018-05-11 ENCOUNTER — Telehealth: Payer: Self-pay | Admitting: Psychiatry

## 2018-05-11 NOTE — Telephone Encounter (Signed)
Need to pull paper chart to review

## 2018-05-11 NOTE — Telephone Encounter (Signed)
Requesting refill on Dextrostat 5mg . Please call when ready.

## 2018-05-12 ENCOUNTER — Other Ambulatory Visit: Payer: Self-pay | Admitting: Psychiatry

## 2018-05-12 MED ORDER — DEXTROAMPHETAMINE SULFATE 5 MG PO TABS: ORAL_TABLET | ORAL | 0 refills | Status: DC

## 2018-05-12 NOTE — Telephone Encounter (Signed)
Will eprescribe dextrostat 5 mg bid

## 2018-06-01 ENCOUNTER — Ambulatory Visit: Payer: Self-pay | Admitting: Psychiatry

## 2018-11-02 DIAGNOSIS — H40033 Anatomical narrow angle, bilateral: Secondary | ICD-10-CM | POA: Diagnosis not present

## 2018-11-02 DIAGNOSIS — H40013 Open angle with borderline findings, low risk, bilateral: Secondary | ICD-10-CM | POA: Diagnosis not present

## 2018-11-02 DIAGNOSIS — H52203 Unspecified astigmatism, bilateral: Secondary | ICD-10-CM | POA: Diagnosis not present

## 2018-12-10 DIAGNOSIS — H40033 Anatomical narrow angle, bilateral: Secondary | ICD-10-CM | POA: Diagnosis not present

## 2018-12-10 DIAGNOSIS — H40031 Anatomical narrow angle, right eye: Secondary | ICD-10-CM | POA: Diagnosis not present

## 2018-12-24 DIAGNOSIS — H40033 Anatomical narrow angle, bilateral: Secondary | ICD-10-CM | POA: Diagnosis not present

## 2018-12-24 DIAGNOSIS — H40032 Anatomical narrow angle, left eye: Secondary | ICD-10-CM | POA: Diagnosis not present

## 2018-12-26 DIAGNOSIS — S76011A Strain of muscle, fascia and tendon of right hip, initial encounter: Secondary | ICD-10-CM | POA: Diagnosis not present

## 2019-01-14 DIAGNOSIS — H40033 Anatomical narrow angle, bilateral: Secondary | ICD-10-CM | POA: Diagnosis not present

## 2019-01-18 DIAGNOSIS — Z01419 Encounter for gynecological examination (general) (routine) without abnormal findings: Secondary | ICD-10-CM | POA: Diagnosis not present

## 2019-01-18 DIAGNOSIS — F329 Major depressive disorder, single episode, unspecified: Secondary | ICD-10-CM | POA: Diagnosis not present

## 2019-01-18 DIAGNOSIS — R030 Elevated blood-pressure reading, without diagnosis of hypertension: Secondary | ICD-10-CM | POA: Diagnosis not present

## 2019-01-18 DIAGNOSIS — Z806 Family history of leukemia: Secondary | ICD-10-CM | POA: Diagnosis not present

## 2019-01-18 DIAGNOSIS — Z6834 Body mass index (BMI) 34.0-34.9, adult: Secondary | ICD-10-CM | POA: Diagnosis not present

## 2019-01-18 DIAGNOSIS — Z800 Family history of malignant neoplasm of digestive organs: Secondary | ICD-10-CM | POA: Diagnosis not present

## 2019-01-18 DIAGNOSIS — Z803 Family history of malignant neoplasm of breast: Secondary | ICD-10-CM | POA: Diagnosis not present

## 2019-01-18 DIAGNOSIS — Z1231 Encounter for screening mammogram for malignant neoplasm of breast: Secondary | ICD-10-CM | POA: Diagnosis not present

## 2019-03-08 ENCOUNTER — Other Ambulatory Visit: Payer: Self-pay

## 2019-03-09 LAB — SARS CORONAVIRUS 2 (TAT 6-24 HRS): SARS Coronavirus 2: NEGATIVE

## 2019-04-27 ENCOUNTER — Other Ambulatory Visit: Payer: Self-pay

## 2019-05-10 DIAGNOSIS — R609 Edema, unspecified: Secondary | ICD-10-CM | POA: Diagnosis not present

## 2019-05-27 DIAGNOSIS — Z13228 Encounter for screening for other metabolic disorders: Secondary | ICD-10-CM | POA: Diagnosis not present

## 2019-05-27 DIAGNOSIS — Z139 Encounter for screening, unspecified: Secondary | ICD-10-CM | POA: Diagnosis not present

## 2019-05-27 DIAGNOSIS — Z13220 Encounter for screening for lipoid disorders: Secondary | ICD-10-CM | POA: Diagnosis not present

## 2019-05-27 DIAGNOSIS — E039 Hypothyroidism, unspecified: Secondary | ICD-10-CM | POA: Diagnosis not present

## 2019-06-04 DIAGNOSIS — Z131 Encounter for screening for diabetes mellitus: Secondary | ICD-10-CM | POA: Diagnosis not present

## 2019-06-04 DIAGNOSIS — Z809 Family history of malignant neoplasm, unspecified: Secondary | ICD-10-CM | POA: Diagnosis not present

## 2019-06-04 DIAGNOSIS — F329 Major depressive disorder, single episode, unspecified: Secondary | ICD-10-CM | POA: Diagnosis not present

## 2019-06-04 DIAGNOSIS — N952 Postmenopausal atrophic vaginitis: Secondary | ICD-10-CM | POA: Diagnosis not present

## 2019-06-07 LAB — SARS CORONAVIRUS 2 (TAT 6-24 HRS): SARS Coronavirus 2: NEGATIVE

## 2019-06-08 ENCOUNTER — Other Ambulatory Visit: Payer: Self-pay | Admitting: Obstetrics and Gynecology

## 2019-06-08 DIAGNOSIS — Z9189 Other specified personal risk factors, not elsewhere classified: Secondary | ICD-10-CM

## 2019-06-10 DIAGNOSIS — M25561 Pain in right knee: Secondary | ICD-10-CM | POA: Diagnosis not present

## 2019-06-18 DIAGNOSIS — M25561 Pain in right knee: Secondary | ICD-10-CM | POA: Diagnosis not present

## 2019-06-22 DIAGNOSIS — I890 Lymphedema, not elsewhere classified: Secondary | ICD-10-CM | POA: Diagnosis not present

## 2019-07-14 DIAGNOSIS — S83231A Complex tear of medial meniscus, current injury, right knee, initial encounter: Secondary | ICD-10-CM | POA: Diagnosis not present

## 2019-07-14 DIAGNOSIS — Y999 Unspecified external cause status: Secondary | ICD-10-CM | POA: Diagnosis not present

## 2019-07-14 DIAGNOSIS — M94261 Chondromalacia, right knee: Secondary | ICD-10-CM | POA: Diagnosis not present

## 2019-07-14 DIAGNOSIS — S83261A Peripheral tear of lateral meniscus, current injury, right knee, initial encounter: Secondary | ICD-10-CM | POA: Diagnosis not present

## 2019-07-14 DIAGNOSIS — S83271A Complex tear of lateral meniscus, current injury, right knee, initial encounter: Secondary | ICD-10-CM | POA: Diagnosis not present

## 2019-07-23 DIAGNOSIS — I890 Lymphedema, not elsewhere classified: Secondary | ICD-10-CM | POA: Diagnosis not present

## 2019-07-24 ENCOUNTER — Other Ambulatory Visit: Payer: BLUE CROSS/BLUE SHIELD

## 2019-08-08 ENCOUNTER — Encounter (HOSPITAL_COMMUNITY): Payer: Self-pay | Admitting: Psychiatry

## 2019-08-08 ENCOUNTER — Other Ambulatory Visit: Payer: Self-pay

## 2019-08-08 ENCOUNTER — Observation Stay (HOSPITAL_COMMUNITY)
Admission: RE | Admit: 2019-08-08 | Discharge: 2019-08-09 | Disposition: A | Discharge status: Home / Self Care | Payer: BC Managed Care – PPO | Attending: Psychiatry | Admitting: Psychiatry

## 2019-08-08 DIAGNOSIS — R45851 Suicidal ideations: Secondary | ICD-10-CM | POA: Diagnosis not present

## 2019-08-08 DIAGNOSIS — Z8349 Family history of other endocrine, nutritional and metabolic diseases: Secondary | ICD-10-CM | POA: Diagnosis not present

## 2019-08-08 DIAGNOSIS — Z20822 Contact with and (suspected) exposure to covid-19: Secondary | ICD-10-CM | POA: Diagnosis not present

## 2019-08-08 DIAGNOSIS — Z888 Allergy status to other drugs, medicaments and biological substances status: Secondary | ICD-10-CM | POA: Diagnosis not present

## 2019-08-08 DIAGNOSIS — E063 Autoimmune thyroiditis: Secondary | ICD-10-CM | POA: Insufficient documentation

## 2019-08-08 DIAGNOSIS — Z885 Allergy status to narcotic agent status: Secondary | ICD-10-CM | POA: Diagnosis not present

## 2019-08-08 DIAGNOSIS — F5081 Binge eating disorder: Secondary | ICD-10-CM | POA: Diagnosis not present

## 2019-08-08 DIAGNOSIS — F332 Major depressive disorder, recurrent severe without psychotic features: Principal | ICD-10-CM | POA: Insufficient documentation

## 2019-08-08 LAB — RESPIRATORY PANEL BY RT PCR (FLU A&B, COVID)
Influenza A by PCR: NEGATIVE
Influenza B by PCR: NEGATIVE
SARS Coronavirus 2 by RT PCR: NEGATIVE

## 2019-08-08 MED ORDER — HYDROXYZINE HCL 25 MG PO TABS
25.0000 mg | ORAL_TABLET | Freq: Three times a day (TID) | ORAL | Status: DC | PRN
  Administered 2019-08-08: 25 mg via ORAL
  Filled 2019-08-08 (×2): qty 1

## 2019-08-08 MED ORDER — ACETAMINOPHEN 325 MG PO TABS
650.0000 mg | ORAL_TABLET | Freq: Four times a day (QID) | ORAL | Status: DC | PRN
  Filled 2019-08-08: qty 2

## 2019-08-08 MED ORDER — TRAZODONE HCL 50 MG PO TABS
50.0000 mg | ORAL_TABLET | Freq: Every evening | ORAL | Status: DC | PRN
  Filled 2019-08-08: qty 1

## 2019-08-08 MED ORDER — MAGNESIUM HYDROXIDE 400 MG/5ML PO SUSP
30.0000 mL | Freq: Every day | ORAL | Status: DC | PRN
  Filled 2019-08-08: qty 30

## 2019-08-08 MED ORDER — ALUM & MAG HYDROXIDE-SIMETH 200-200-20 MG/5ML PO SUSP
30.0000 mL | ORAL | Status: DC | PRN
Start: 2019-08-08 — End: 2019-08-09
  Filled 2019-08-08: qty 30

## 2019-08-08 NOTE — H&P (Addendum)
Behavioral Health Medical Screening Exam  Megan Cook is an 54 y.o. female.  Presents with husband [name]) who has concerns at patient has been inconsolable for the past 2 days.  Patient reports " nothing is wrong with me I just want to die."  Reports she relapsed on last night with alcohol.  States she had already advised her sponsor and support person and feels terrible about the situation.  States passive suicidal ideations denies plan or intent.  Husband [name] reports patient may have used illicit substance however he is unsure of which medication she ingested. Reported he didn't feel safe taking her home today.   Patient observed crying uncontrollably. citing mood irritability, depression and suicidal ideations.  Reports she is prescribed Wellbutrin by her primary care provider.  States she was referred to [facility] to see Melony Overly PA however has not followed up as of yet. Support, encouragement and reassurance was provided.    Total Time spent with patient: 15 minutes  Psychiatric Specialty Exam: Physical Exam  Vitals reviewed. Psychiatric: She has a normal mood and affect. Her behavior is normal.    Review of Systems  Psychiatric/Behavioral: Positive for suicidal ideas. Negative for hallucinations. The patient is nervous/anxious.   All other systems reviewed and are negative.   Last menstrual period 07/13/2011.There is no height or weight on file to calculate BMI.  General Appearance: Casual  Eye Contact:  Fair  Speech:  Clear and Coherent  Volume:  Normal  Mood:  Anxious, Depressed and Irritable  Affect:  Congruent  Thought Process:  Linear  Orientation:  Full (Time, Place, and Person)  Thought Content:  Hallucinations: None  Suicidal Thoughts:  Yes.  with intent/plan  Homicidal Thoughts:  No  Memory:  Immediate;   Fair Recent;   Fair  Judgement:  Fair  Insight:  Fair  Psychomotor Activity:  Normal  Concentration: Concentration: Fair  Recall:  [employer] of  Knowledge:Fair  Language: Fair  Akathisia:  No  Handed:  Right  AIMS (if indicated):     Assets:  Communication Skills Desire for Improvement Resilience Social Support  Sleep:       Musculoskeletal: Strength & Muscle Tone: within normal limits Gait & Station: normal Patient leans: N/A  Last menstrual period 07/13/2011.  Recommendations: Overnight observation  Based on my evaluation the patient does not appear to have an emergency medical condition.  Oneta Rack, NP 08/08/2019, 3:24 PM
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
## 2019-08-08 NOTE — Plan of Care (Signed)
[facility] Crises Note  Reason for Crisis Plan:  Suicidal ideation  Plan of Care:  Crises Stabilization  Family Support:    Husband  Current Living Environment:  Living Arrangements: Spouse/significant other  Insurance:   Hospital Account    Name Acct ID Class Status Primary Coverage   Jozlynn, Plaia [id number] BEHAVIORAL HEALTH OBSERVATION Open BLUE CROSS BLUE SHIELD - BCBS COMM PPO        Guarantor Account (for Hospital Account [id number])    Name Relation to Pt Service Area Active? Acct Type   Royal Hawthorn Self [facility] Yes Behavioral Health   Address Phone       [address] [place], [place] [address] [phone](H)          Coverage Information (for Hospital Account [id number])    F/O Payor/Plan Precert #   [facility] SHIELD/BCBS COMM PPO    Subscriber Subscriber #   Annett, Boxwell [id number]   Address Phone   [address] [place], [place] [address] [phone]      Legal Guardian:     Primary Care Provider:  Patient, No Pcp Per  Current Outpatient Providers:  None  Psychiatrist:  Name of Psychiatrist: None  Counselor/Therapist:  Name of Therapist: Bradley Ferris  Compliant with Medications:  yes  Additional Information: Patient is a 54 year old Caucasian female brought to the Walk in section of [facility] due to suicidal ideations secondary to worsening pain caused by her lipedema. Patient states that she feels guilty because she drank alcohol yesterday after being sober for sometime now, and following up with AA. Pt tearful, medicated with Atarax 25mg  for anxiety. Pt verbalizing suicidal ideations, verbally contracts for safety on the unit, denies auditory /visual hallucinations. Q15 minute checks initiated for safety.04-21-1978 3/14/20216:49 PM

## 2019-08-08 NOTE — Progress Notes (Signed)
Pt transferred from 200 hall to 402-1. Pt ambulatory with steady gait. Pt teary and worried of being a bother. Pt encouraged to voice her needs and should not feel like she is bothering staff. Pt oriented to the unit, food drinks offered, denied SI/HI. Will continue to monitor.

## 2019-08-09 DIAGNOSIS — F332 Major depressive disorder, recurrent severe without psychotic features: Principal | ICD-10-CM

## 2019-08-09 LAB — CBC
HCT: 40.6 % (ref 36.0–46.0)
Hemoglobin: 13.3 g/dL (ref 12.0–15.0)
MCH: 27.9 pg (ref 26.0–34.0)
MCHC: 32.8 g/dL (ref 30.0–36.0)
MCV: 85.1 fL (ref 80.0–100.0)
Platelets: 371 10*3/uL (ref 150–400)
RBC: 4.77 MIL/uL (ref 3.87–5.11)
RDW: 13 % (ref 11.5–15.5)
WBC: 5.6 10*3/uL (ref 4.0–10.5)
nRBC: 0 % (ref 0.0–0.2)

## 2019-08-09 LAB — COMPREHENSIVE METABOLIC PANEL
ALT: 33 U/L (ref 0–44)
AST: 26 U/L (ref 15–41)
Albumin: 4.2 g/dL (ref 3.5–5.0)
Alkaline Phosphatase: 90 U/L (ref 38–126)
Anion gap: 12 (ref 5–15)
BUN: 21 mg/dL — ABNORMAL HIGH (ref 6–20)
CO2: 27 mmol/L (ref 22–32)
Calcium: 8.7 mg/dL — ABNORMAL LOW (ref 8.9–10.3)
Chloride: 100 mmol/L (ref 98–111)
Creatinine, Ser: 0.68 mg/dL (ref 0.44–1.00)
GFR calc Af Amer: >60 mL/min (ref >60)
GFR calc non Af Amer: >60 mL/min (ref >60)
Glucose, Bld: 97 mg/dL (ref 70–99)
Potassium: 3.6 mmol/L (ref 3.5–5.1)
Sodium: 139 mmol/L (ref 135–145)
Total Bilirubin: 0.6 mg/dL (ref 0.3–1.2)
Total Protein: 7.1 g/dL (ref 6.5–8.1)

## 2019-08-09 LAB — TSH: TSH: 6.061 u[IU]/mL — ABNORMAL HIGH (ref 0.350–4.500)

## 2019-08-09 LAB — RAPID URINE DRUG SCREEN, HOSP PERFORMED
Amphetamines: NOT DETECTED
Barbiturates: NOT DETECTED
Benzodiazepines: NOT DETECTED
Cocaine: NOT DETECTED
Opiates: NOT DETECTED
Tetrahydrocannabinol: NOT DETECTED

## 2019-08-09 LAB — LIPID PANEL
Cholesterol: 173 mg/dL (ref 0–200)
HDL: 65 mg/dL (ref >40)
LDL Cholesterol: 46 mg/dL (ref 0–99)
Total CHOL/HDL Ratio: 2.7 RATIO
Triglycerides: 308 mg/dL — ABNORMAL HIGH (ref <150)
VLDL: 62 mg/dL — ABNORMAL HIGH (ref 0–40)

## 2019-08-09 LAB — HEMOGLOBIN A1C
Hgb A1c MFr Bld: 5.6 % (ref 4.8–5.6)
Mean Plasma Glucose: 114.02 mg/dL

## 2019-08-09 LAB — ETHANOL: Alcohol, Ethyl (B): 48 mg/dL — ABNORMAL HIGH (ref <10)

## 2019-08-09 MED ORDER — VORTIOXETINE HBR 10 MG PO TABS: 10.0000 mg | ORAL_TABLET | Freq: Every day | ORAL | 2 refills | Status: DC

## 2019-08-09 NOTE — Discharge Instructions (Signed)
For your behavioral health needs, you are advised to follow up with an outpatient psychiatrist.  You are scheduled for an intake appointment with the [facility] at [facility].  The appointment is scheduled for Tuesday, August 24, 2019 at 9:00 am.  They will be mailing registration paperwork to you.  Please complete it and return it to them prior to the appointment:       [facility] at [facility]. [address]      [place], [place] [address]      [phone]

## 2019-08-09 NOTE — Discharge Summary (Signed)
Physician Discharge Summary Note  Patient:  Megan Cook is an 54 y.o., female MRN:  [id number] DOB:  1966-05-23 Patient phone:  [phone] (home)  Patient address:   [address] [place] [address],  Total Time spent with patient: 45 minutes  Date of Admission:  08/08/2019 Date of Discharge: 08/09/2019  Reason for Admission:   Megan Cook is a 54 y.o. female who presented to [facility] as a voluntary walk-in with complaint of despondency, passive suicidal ideation, and other symptoms.  Pt was transported to [facility] by her husband [name] [phone]).  Pt lives in [place] with her husband, and she is employed.  Pt is not followed by an outpatient psychiatrist, but she is prescribed Wellbutrin by her OB/GYN.  Pt has not been assessed by TTS before.  Pt reported as follows:  She stated that she came to [facility] at her husband's request because she is significantly depressed over pain caused by worsening lipedema.  Pt reported that the pain has been so bad that yesterday, she broke sobriety and drank an unknown quantity of hard liquor (Pt participates in AA).  Pt endorsed passive suicidal ideation (''I wish I were dead,'') despondency, disturbed sleep and appetite, tearfulness, fatigue, feeling ''overwhelmed,'' and loss of pleasure in previously pleasurable activities.  Pt denied homicidal ideation, hallucination, and self-injurious behavior.  When asked what help she would like to receive, Pt stated that she wanted to go home.  She stated that she came to the hospital because her husband was concerned.  She stated that she wanted to leave.  Author spoke with Pt's husband who confirmed that Pt made passive suicidal statements.  He also reported that he believes Pt's Wellbutrin dosage should be adjusted.  Advised of the role of TTS assessment.  During assessment, Pt presented as alert and oriented.  She had good eye contact and was cooperative.  Demeanor was tearful.  Pt was dressed in street clothes,  and she appeared appropriately groomed.  Mood and affect were depressed.  Pt's speech was normal in rate, rhythm, and volume.  Memory and concentration were intact.  Thought processes were within normal range, and thought content was logical and goal-oriented.  There was no evidence of delusion.  Pt's memory and concentration were intact. Insight, judgment, and impulse control were fair.  Consulted with Chilton Greathouse, NP, who determined that, based on current state of distress, Pt is to be placed in OBS. Principal Problem: <principal problem not specified> Discharge Diagnoses: Active Problems:   MDD (major depressive disorder), recurrent severe, without psychosis (HCC)   Past Psychiatric History: see eval  Past Medical History:  Past Medical History:  Diagnosis Date  . Depression   . Heart murmur   . Hypothyroidism due to Hashimoto's thyroiditis   . Lymph edema   . Substance abuse (HCC) 03/07/09   Clean since 03/07/2009 and in recovery    Past Surgical History:  Procedure Laterality Date  . CESAREAN SECTION    . left knee    . MEDIAL PARTIAL KNEE REPLACEMENT Left   . neck fusion    . TONSILLECTOMY    . TUBAL LIGATION     Family History:  Family History  Problem Relation Age of Onset  . Stroke Mother   . Thyroid disease Mother   . Cancer Father 33       Hairy Cell Leukemia  . Colon cancer Father 58  . Thyroid disease Sister   . Thyroid disease Sister    Family Psychiatric  History: see eval Social History:  Social History   Substance and Sexual Activity  Alcohol Use No  . Alcohol/week: 0.0 standard drinks   Comment: NOTE:  Pt broke sobriety on 08/08/19 see notes     Social History   Substance and Sexual Activity  Drug Use No    Social History   Socioeconomic History  . Marital status: Married    Spouse name: Not on file  . Number of children: 2  . Years of education: Not on file  . Highest education level: Not on file  Occupational History    Comment: [employer], [place]  Tobacco Use  . Smoking status: Never Smoker  . Smokeless tobacco: Never Used  Substance and Sexual Activity  . Alcohol use: No    Alcohol/week: 0.0 standard drinks    Comment: NOTE:  Pt broke sobriety on 08/08/19 see notes  . Drug use: No  . Sexual activity: Yes    Birth control/protection: Surgical  Other Topics Concern  . Not on file  Social History Narrative   Pt lives in [place] with husband.  She works for [employer], [place].  Pt is not followed by an outpatient psychiatrist.  As of this writing, her OB/GYN prescribes Welbutrin.   Social Determinants of Health   Financial Resource Strain:   . Difficulty of Paying Living Expenses:   Food Insecurity:   . Worried About Programme researcher, broadcasting/film/video in the Last Year:   . Barista in the Last Year:   Transportation Needs:   . Freight forwarder (Medical):   Marland Kitchen Lack of Transportation (Non-Medical):   Physical Activity:   . Days of Exercise per Week:   . Minutes of Exercise per Session:   Stress:   . Feeling of Stress :   Social Connections:   . Frequency of Communication with Friends and Family:   . Frequency of Social Gatherings with Friends and Family:   . Attends Religious Services:   . Active Member of Clubs or Organizations:   . Attends Banker Meetings:   Marland Kitchen Marital Status:     Hospital Course:    Ms. Dicola is 14 she was admitted to observation, but the morning she was alert oriented and cooperative, she felt that her passive suicidal statements were related to being intoxicated she states "I do not usually drink" she did not want medication adjustments felt that the Wellbutrin was simply not adequate as monotherapy.  She stated she left her job did not want to miss her job today so we decided to make the following med adjustments given that she could contract for safety had no suicidal thoughts plans or intent and no psychosis or mania and did not require detox  measures.  Add vortioxetine to her Wellbutrin dosing.  She requests no other med adjustments because she does not want a sleeping pill-  Musculoskeletal: Strength & Muscle Tone: within normal limits Gait & Station: normal Patient leans: N/A  Psychiatric Specialty Exam: Physical Exam  Review of Systems  Blood pressure 139/89, pulse (!) 116, temperature 99.4 F (37.4 C), temperature source Oral, resp. rate 20, last menstrual period 07/13/2011, SpO2 96 %.There is no height or weight on file to calculate BMI.  General Appearance: Casual and Disheveled  Eye Contact:  Good  Speech:  Normal Rate  Volume:  Normal  Mood:  Anxious and Dysphoric  Affect:  Appropriate  Thought Process:  Coherent and Goal Directed  Orientation:  Full (Time, Place, and Person)  Thought Content:  Logical  Suicidal Thoughts:  No  Homicidal Thoughts:  No  Memory:  Immediate;   Fair Recent;   Good Remote;   Good  Judgement:  Fair  Insight:  Good  Psychomotor Activity:  Normal  Concentration:  Concentration: Good and Attention Span: Good  Recall:  Good  Fund of Knowledge:  Good  Language:  Good  Akathisia:  Negative  Handed:  Right  AIMS (if indicated):     Assets:  Communication Skills Desire for Improvement  ADL's:  Intact  Cognition:  WNL  Sleep:           Has this patient used any form of tobacco in the last 30 days? (Cigarettes, Smokeless Tobacco, Cigars, and/or Pipes) Yes, No  Blood Alcohol level:  Lab Results  Component Value Date   ETH 48 (H) 11/17/7626    Metabolic Disorder Labs:  No results found for: HGBA1C, MPG No results found for: PROLACTIN Lab Results  Component Value Date   CHOL 173 08/09/2019   TRIG 308 (H) 08/09/2019   HDL 65 08/09/2019   CHOLHDL 2.7 08/09/2019   VLDL 62 (H) 08/09/2019   LDLCALC 46 08/09/2019   LDLCALC 73 04/04/2008    See Psychiatric Specialty Exam and Suicide Risk Assessment completed by Attending Physician prior to discharge.  Discharge  destination:  Home  Is patient on multiple antipsychotic therapies at discharge:  No   Has Patient had three or more failed trials of antipsychotic monotherapy by history:  No  Recommended Plan for Multiple Antipsychotic Therapies: NA   Allergies as of 08/09/2019      Reactions   Methylisothiazolinone Rash   Causes eczema    Carbocaine [mepivacaine Hcl]    Muscle pain and swelling.   Codeine Itching, Nausea Only      Medication List    STOP taking these medications   dextroamphetamine 5 MG tablet Commonly known as: DEXTROSTAT     TAKE these medications     Indication  betamethasone dipropionate 0.05 % cream Apply topically 2 (two) times daily.  Indication: Atopic Dermatitis   levothyroxine 50 MCG tablet Commonly known as: Synthroid Take 1 tablet (50 mcg total) by mouth daily before breakfast.  Indication: Underactive Thyroid   lisdexamfetamine 30 MG capsule Commonly known as: VYVANSE Take 30 mg by mouth daily.  Indication: Binge Eating Disorder   multivitamin tablet Take 1 tablet by mouth daily.  Indication: Vitamin Deficiency   vortioxetine HBr 10 MG Tabs tablet Commonly known as: Trintellix Take 1 tablet (10 mg total) by mouth daily. If not covered substitute lexapro 20 mg a day #40 2 R  Indication: Major Depressive Disorder       SignedJohnn Hai, MD 08/09/2019, 7:45 AM

## 2019-08-09 NOTE — Progress Notes (Signed)
D: Pt A & O X 4. Denies SI, HI, AVH and pain at this time. D/C home as ordered. Picked up in lobby by her husband. A: D/C instructions reviewed with pt including  follow up appointments, compliance encouraged. All belongings from locker 14 given to pt at time of departure. Safety checks maintained without incident till time of d/c.  R: Pt receptive to care. Verbalized understanding related to d/c instructions. Signed belonging sheet in agreement with items received from locker. Ambulatory with a steady gait. Appears to be in no physical distress at time of departure.

## 2019-08-09 NOTE — BH Assessment (Signed)
[facility] Assessment Progress Note  Per Malvin Johns, MD, this pt does not require psychiatric hospitalization at this time.  Pt is to be discharged from the [facility] Observation Unit with referral to an outpatient psychiatrist.  Per pt's nurse, Lincoln Maxin, pt has requested that an appointment be scheduled for her.  Pt is scheduled for an intake appointment on Tuesday 08/24/2019 at 09:00.  This has been included in pt's discharge instructions.  Lincoln Maxin has been notified.  Doylene Canning, MA Triage Specialist [phone]

## 2019-08-13 ENCOUNTER — Inpatient Hospital Stay
Admission: RE | Admit: 2019-08-13 | Discharge: 2019-08-13 | Disposition: A | Discharge status: Home / Self Care | Payer: BC Managed Care – PPO | Source: Ambulatory Visit | Attending: Obstetrics and Gynecology | Admitting: Obstetrics and Gynecology

## 2019-08-20 DIAGNOSIS — I890 Lymphedema, not elsewhere classified: Secondary | ICD-10-CM | POA: Diagnosis not present

## 2019-08-24 ENCOUNTER — Ambulatory Visit (HOSPITAL_COMMUNITY): Payer: BC Managed Care – PPO | Admitting: Psychiatry

## 2019-09-01 ENCOUNTER — Ambulatory Visit (INDEPENDENT_AMBULATORY_CARE_PROVIDER_SITE_OTHER): Payer: BC Managed Care – PPO | Admitting: Physician Assistant

## 2019-09-01 ENCOUNTER — Encounter: Payer: Self-pay | Admitting: Physician Assistant

## 2019-09-01 ENCOUNTER — Other Ambulatory Visit: Payer: Self-pay

## 2019-09-01 VITALS — BP 147/86 | HR 87 | Ht 67.0 in | Wt 209.0 lb

## 2019-09-01 DIAGNOSIS — F411 Generalized anxiety disorder: Secondary | ICD-10-CM | POA: Diagnosis not present

## 2019-09-01 DIAGNOSIS — G4709 Other insomnia: Secondary | ICD-10-CM | POA: Diagnosis not present

## 2019-09-01 DIAGNOSIS — F332 Major depressive disorder, recurrent severe without psychotic features: Secondary | ICD-10-CM

## 2019-09-01 MED ORDER — TRAZODONE HCL 100 MG PO TABS: 50.0000 mg | ORAL_TABLET | Freq: Every evening | ORAL | 1 refills | Status: DC | PRN

## 2019-09-01 MED ORDER — VENLAFAXINE HCL ER 75 MG PO CP24: 75.0000 mg | ORAL_CAPSULE | Freq: Every day | ORAL | 1 refills | Status: DC

## 2019-09-01 MED ORDER — BUPROPION HCL ER (XL) 150 MG PO TB24: 150.0000 mg | ORAL_TABLET | Freq: Every day | ORAL | 1 refills | Status: DC
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
## 2019-09-15 ENCOUNTER — Other Ambulatory Visit: Payer: BC Managed Care – PPO

## 2019-09-29 ENCOUNTER — Ambulatory Visit: Payer: BC Managed Care – PPO | Admitting: Physician Assistant

## 2019-10-07 ENCOUNTER — Ambulatory Visit
Admission: RE | Admit: 2019-10-07 | Discharge: 2019-10-07 | Disposition: A | Discharge status: Home / Self Care | Payer: BC Managed Care – PPO | Source: Ambulatory Visit | Attending: Obstetrics and Gynecology | Admitting: Obstetrics and Gynecology

## 2019-10-07 ENCOUNTER — Other Ambulatory Visit: Payer: Self-pay

## 2019-10-07 DIAGNOSIS — N6489 Other specified disorders of breast: Secondary | ICD-10-CM | POA: Diagnosis not present

## 2019-10-07 DIAGNOSIS — Z9189 Other specified personal risk factors, not elsewhere classified: Secondary | ICD-10-CM

## 2019-10-07 MED ORDER — GADOBUTROL 1 MMOL/ML IV SOLN
10.0000 mL | Freq: Once | INTRAVENOUS | Status: AC | PRN
  Administered 2019-10-07: 10 mL via INTRAVENOUS

## 2019-10-19 DIAGNOSIS — I890 Lymphedema, not elsewhere classified: Secondary | ICD-10-CM | POA: Diagnosis not present

## 2019-10-22 DIAGNOSIS — M545 Low back pain: Secondary | ICD-10-CM | POA: Diagnosis not present

## 2019-11-03 DIAGNOSIS — M25561 Pain in right knee: Secondary | ICD-10-CM | POA: Diagnosis not present

## 2019-11-05 ENCOUNTER — Other Ambulatory Visit: Payer: Self-pay | Admitting: Physician Assistant

## 2019-11-13 DIAGNOSIS — M25561 Pain in right knee: Secondary | ICD-10-CM | POA: Diagnosis not present

## 2019-12-03 ENCOUNTER — Other Ambulatory Visit: Payer: Self-pay | Admitting: Physician Assistant

## 2019-12-22 ENCOUNTER — Ambulatory Visit: Payer: BC Managed Care – PPO | Admitting: Physician Assistant

## 2020-01-12 ENCOUNTER — Ambulatory Visit: Payer: BC Managed Care – PPO | Admitting: Physician Assistant

## 2020-01-12 ENCOUNTER — Other Ambulatory Visit: Payer: Self-pay | Admitting: Physician Assistant

## 2020-01-19 DIAGNOSIS — Z01419 Encounter for gynecological examination (general) (routine) without abnormal findings: Secondary | ICD-10-CM | POA: Diagnosis not present

## 2020-01-19 DIAGNOSIS — Z6835 Body mass index (BMI) 35.0-35.9, adult: Secondary | ICD-10-CM | POA: Diagnosis not present

## 2020-02-02 ENCOUNTER — Ambulatory Visit: Payer: BC Managed Care – PPO | Admitting: Physician Assistant

## 2020-02-02 DIAGNOSIS — H524 Presbyopia: Secondary | ICD-10-CM | POA: Diagnosis not present

## 2020-02-02 DIAGNOSIS — H53001 Unspecified amblyopia, right eye: Secondary | ICD-10-CM | POA: Diagnosis not present

## 2020-02-02 DIAGNOSIS — H5000 Unspecified esotropia: Secondary | ICD-10-CM | POA: Diagnosis not present

## 2020-02-02 DIAGNOSIS — H40013 Open angle with borderline findings, low risk, bilateral: Secondary | ICD-10-CM | POA: Diagnosis not present

## 2020-02-15 ENCOUNTER — Other Ambulatory Visit: Payer: Self-pay | Admitting: Physician Assistant

## 2020-02-16 NOTE — Telephone Encounter (Signed)
review 

## 2020-02-17 DIAGNOSIS — M25561 Pain in right knee: Secondary | ICD-10-CM | POA: Diagnosis not present

## 2020-02-17 DIAGNOSIS — M545 Low back pain: Secondary | ICD-10-CM | POA: Diagnosis not present

## 2020-02-17 DIAGNOSIS — M7061 Trochanteric bursitis, right hip: Secondary | ICD-10-CM | POA: Diagnosis not present

## 2020-03-08 ENCOUNTER — Ambulatory Visit: Payer: BC Managed Care – PPO | Admitting: Physician Assistant

## 2020-03-22 ENCOUNTER — Other Ambulatory Visit: Payer: Self-pay | Admitting: Physician Assistant

## 2020-04-04 DIAGNOSIS — M5459 Other low back pain: Secondary | ICD-10-CM | POA: Diagnosis not present

## 2020-04-19 ENCOUNTER — Ambulatory Visit (INDEPENDENT_AMBULATORY_CARE_PROVIDER_SITE_OTHER): Payer: BC Managed Care – PPO | Admitting: Physician Assistant

## 2020-04-19 ENCOUNTER — Encounter: Payer: Self-pay | Admitting: Physician Assistant

## 2020-04-19 ENCOUNTER — Other Ambulatory Visit: Payer: Self-pay

## 2020-04-19 DIAGNOSIS — F411 Generalized anxiety disorder: Secondary | ICD-10-CM

## 2020-04-19 DIAGNOSIS — F332 Major depressive disorder, recurrent severe without psychotic features: Secondary | ICD-10-CM

## 2020-04-19 DIAGNOSIS — G4709 Other insomnia: Secondary | ICD-10-CM | POA: Diagnosis not present

## 2020-04-19 DIAGNOSIS — M5459 Other low back pain: Secondary | ICD-10-CM | POA: Diagnosis not present

## 2020-04-19 MED ORDER — BUPROPION HCL ER (XL) 150 MG PO TB24: 150.0000 mg | ORAL_TABLET | Freq: Every day | ORAL | 0 refills | Status: DC

## 2020-04-19 MED ORDER — HYDROXYZINE HCL 25 MG PO TABS: 12.5000 mg | ORAL_TABLET | Freq: Three times a day (TID) | ORAL | 2 refills | Status: DC | PRN
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
## 2020-04-27 DIAGNOSIS — M5459 Other low back pain: Secondary | ICD-10-CM | POA: Diagnosis not present

## 2020-05-17 DIAGNOSIS — M5459 Other low back pain: Secondary | ICD-10-CM | POA: Diagnosis not present

## 2020-05-25 DIAGNOSIS — M5459 Other low back pain: Secondary | ICD-10-CM | POA: Diagnosis not present

## 2020-06-21 DIAGNOSIS — M545 Low back pain, unspecified: Secondary | ICD-10-CM | POA: Diagnosis not present

## 2020-07-05 DIAGNOSIS — M545 Low back pain, unspecified: Secondary | ICD-10-CM | POA: Diagnosis not present

## 2020-07-12 DIAGNOSIS — M545 Low back pain, unspecified: Secondary | ICD-10-CM | POA: Diagnosis not present

## 2020-07-19 ENCOUNTER — Ambulatory Visit: Payer: BC Managed Care – PPO | Admitting: Physician Assistant

## 2020-07-19 DIAGNOSIS — M545 Low back pain, unspecified: Secondary | ICD-10-CM | POA: Diagnosis not present

## 2020-08-02 ENCOUNTER — Other Ambulatory Visit: Payer: Self-pay | Admitting: Physician Assistant

## 2020-08-02 DIAGNOSIS — M545 Low back pain, unspecified: Secondary | ICD-10-CM | POA: Diagnosis not present

## 2020-08-08 DIAGNOSIS — L821 Other seborrheic keratosis: Secondary | ICD-10-CM | POA: Diagnosis not present

## 2020-08-08 DIAGNOSIS — L239 Allergic contact dermatitis, unspecified cause: Secondary | ICD-10-CM | POA: Diagnosis not present

## 2020-08-09 DIAGNOSIS — M545 Low back pain, unspecified: Secondary | ICD-10-CM | POA: Diagnosis not present

## 2020-08-23 DIAGNOSIS — M545 Low back pain, unspecified: Secondary | ICD-10-CM | POA: Diagnosis not present

## 2020-08-26 DIAGNOSIS — M5416 Radiculopathy, lumbar region: Secondary | ICD-10-CM | POA: Diagnosis not present

## 2020-08-31 DIAGNOSIS — M47816 Spondylosis without myelopathy or radiculopathy, lumbar region: Secondary | ICD-10-CM | POA: Diagnosis not present

## 2020-08-31 DIAGNOSIS — M5136 Other intervertebral disc degeneration, lumbar region: Secondary | ICD-10-CM | POA: Diagnosis not present

## 2020-09-06 DIAGNOSIS — M545 Low back pain, unspecified: Secondary | ICD-10-CM | POA: Diagnosis not present

## 2020-09-13 DIAGNOSIS — U071 COVID-19: Secondary | ICD-10-CM | POA: Diagnosis not present

## 2020-09-13 DIAGNOSIS — R059 Cough, unspecified: Secondary | ICD-10-CM | POA: Diagnosis not present

## 2020-09-16 DIAGNOSIS — U071 COVID-19: Secondary | ICD-10-CM | POA: Diagnosis not present

## 2020-09-20 ENCOUNTER — Telehealth (INDEPENDENT_AMBULATORY_CARE_PROVIDER_SITE_OTHER): Payer: BC Managed Care – PPO | Admitting: Physician Assistant

## 2020-09-20 ENCOUNTER — Encounter: Payer: Self-pay | Admitting: Physician Assistant

## 2020-09-20 DIAGNOSIS — F411 Generalized anxiety disorder: Secondary | ICD-10-CM

## 2020-09-20 DIAGNOSIS — F325 Major depressive disorder, single episode, in full remission: Secondary | ICD-10-CM

## 2020-09-20 MED ORDER — BUPROPION HCL ER (XL) 150 MG PO TB24: 1.0000 | ORAL_TABLET | Freq: Every day | ORAL | 1 refills | Status: DC

## 2020-09-20 NOTE — Progress Notes (Signed)
[facility] Med Check  Patient ID: Megan Cook,  MRN: [id number]  PCP: Ranae Pila, MD  Date of Evaluation: 09/20/2020 Time spent:25 minutes  Chief Complaint:  Chief Complaint    Anxiety; Depression; Follow-up     Virtual Visit via Telehealth  I connected with patient by telephone, with their informed consent, and verified patient privacy and that I am speaking with the correct person using two identifiers.  I am private, in my office and the patient is at home.  I discussed the limitations, risks, security and privacy concerns of performing an evaluation and management service by telephone  and the availability of in person appointments. I also discussed with the patient that there may be a patient responsible charge related to this service. The patient expressed understanding and agreed to proceed.   I discussed the assessment and treatment plan with the patient. The patient was provided an opportunity to ask questions and all were answered. The patient agreed with the plan and demonstrated an understanding of the instructions.   The patient was advised to call back or seek an in-person evaluation if the symptoms worsen or if the condition fails to improve as anticipated.  I provided 25 minutes of non-face-to-face time during this encounter.  HISTORY/CURRENT STATUS: HPI For routine med check.  Doing well w/ med, until she got covid.  See ROS. Work has been fine, at a retinal specialist. Able to enjoy things. Energy and motivation are good. Not isolating. Not crying easily. No SI/HI.  Anxiety is not much of a problem, not taking the hydroxyzine at all. Sleeps fine, most of the time.   Denies dizziness, syncope, seizures, numbness, tingling, tremor, tics, unsteady gait, slurred speech, confusion. Denies muscle or joint pain, stiffness, or dystonia.  Individual Medical History/ Review of Systems: Changes? :Yes  Has covid, on day 10. Out of work, feels a little  better but still coughing a lot. Has no energy.   Past medications for mental health diagnoses include: Serzone, Zoloft didn't help, Wellbutrin, Dextrostat, Effexor made her feel weird, trazodone caused a severe headache.  Allergies: Methylisothiazolinone, Tramadol hcl, Carbocaine [mepivacaine hcl], Codeine, and Trazodone and nefazodone  Current Medications:  Current Outpatient Medications:  .  levothyroxine (SYNTHROID) 50 MCG tablet, Take 1 tablet (50 mcg total) by mouth daily before breakfast., Disp: 90 tablet, Rfl: 3 .  buPROPion (WELLBUTRIN XL) 150 MG 24 hr tablet, Take 1 tablet (150 mg total) by mouth daily., Disp: 90 tablet, Rfl: 1 .  hydrOXYzine (ATARAX/VISTARIL) 25 MG tablet, Take 0.5-1 tablets (12.5-25 mg total) by mouth every 8 (eight) hours as needed. (Patient not taking: Reported on 09/20/2020), Disp: 60 tablet, Rfl: 2 Medication Side Effects: none  Family Medical/ Social History: Changes? No  MENTAL HEALTH EXAM:  Last menstrual period 07/13/2011.There is no height or weight on file to calculate BMI.  General Appearance: unable to assess  Eye Contact:  unable to assess  Speech:  Clear and Coherent and Normal Rate  Volume:  Normal  Mood:  Euthymic  Affect:  unable to assess  Thought Process:  Goal Directed and Descriptions of Associations: Circumstantial  Orientation:  Full (Time, Place, and Person)  Thought Content: Logical   Suicidal Thoughts:  No  Homicidal Thoughts:  No  Memory:  WNL  Judgement:  Good  Insight:  Good  Psychomotor Activity:  unable to assess  Concentration:  Concentration: Good  Recall:  Good  Fund of Knowledge: Good  Language: Good  Assets:  Desire for Improvement  ADL's:  Intact  Cognition: WNL  Prognosis:  Good    DIAGNOSES:    ICD-10-CM   1. Depression, major, in remission (HCC)  F32.5   2. Generalized anxiety disorder  F41.1     Receiving Psychotherapy: No    RECOMMENDATIONS:  PDMP was reviewed. I provided 25 minutes of  nonface-to-face time during this encounter, including time spent before and after the visit and records review and charting. She's doing well from a psychiatric standpoint, so no changes are necessary. Continue Wellbutrin XL 150 mg, 1 qd. Return in 6 months  Melony Overly, [place]

## 2020-09-22 DIAGNOSIS — U071 COVID-19: Secondary | ICD-10-CM | POA: Diagnosis not present

## 2020-09-22 DIAGNOSIS — R059 Cough, unspecified: Secondary | ICD-10-CM | POA: Diagnosis not present

## 2020-09-25 ENCOUNTER — Telehealth: Payer: Self-pay | Admitting: Physician Assistant

## 2020-09-25 NOTE — Telephone Encounter (Signed)
Pt was just seen on 09/20/20. Please review request.

## 2020-09-25 NOTE — Telephone Encounter (Signed)
Carrington to report that she is struggling after having Covid.  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  A couple of years ago she took Dextrostat 5 mg 2/day.  She stopped because she didn't feel that she needed it anymore.  But now with the Covid brain she thinks perhaps it could help her through until she gets back to normal.  Perhaps just a month or so.  Please call to discuss.   Send to [facility] on [address], [place], [place]

## 2020-09-26 NOTE — Telephone Encounter (Signed)
Sheralyn Boatman, please call and let her know she should discuss with her PCP.  I do not think it is a good idea to use Dextrostat for that purpose.

## 2020-09-26 NOTE — Telephone Encounter (Signed)
Pt informed

## 2020-10-27 DIAGNOSIS — M4316 Spondylolisthesis, lumbar region: Secondary | ICD-10-CM | POA: Diagnosis not present

## 2020-10-27 DIAGNOSIS — Z6834 Body mass index (BMI) 34.0-34.9, adult: Secondary | ICD-10-CM | POA: Diagnosis not present

## 2020-11-09 DIAGNOSIS — M5116 Intervertebral disc disorders with radiculopathy, lumbar region: Secondary | ICD-10-CM | POA: Diagnosis not present

## 2020-11-09 DIAGNOSIS — M5416 Radiculopathy, lumbar region: Secondary | ICD-10-CM | POA: Diagnosis not present

## 2021-01-17 ENCOUNTER — Telehealth: Payer: Self-pay | Admitting: Physician Assistant

## 2021-01-17 ENCOUNTER — Other Ambulatory Visit: Payer: Self-pay | Admitting: Physician Assistant

## 2021-01-17 NOTE — Telephone Encounter (Signed)
Please discuss with her.  In her chart trazodone is listed as an allergy.  Was the only problem a headache?  If so then I will send in a prescription.  If there were other symptoms I will not prescribe it.

## 2021-01-17 NOTE — Telephone Encounter (Signed)
Please review

## 2021-01-17 NOTE — Telephone Encounter (Signed)
Pt called stating she had old Rx of Trazodone she had tried in April 2021 and it gave her a headache. Pt kept bottle. Over the summer she tried Trazodone again very slowly. Started taking 1/4, then 1/2 now 3/4 pill of the 100 mg. Rx exp 04/20/20. She has now ran out completely. Requesting New Rx for Trazodone 100 mg 1/d @ [facility]. Apt 10/12. Stated helps with her anxiety.

## 2021-01-18 ENCOUNTER — Other Ambulatory Visit: Payer: Self-pay | Admitting: Physician Assistant

## 2021-01-18 MED ORDER — TRAZODONE HCL 100 MG PO TABS: 50.0000 mg | ORAL_TABLET | Freq: Every evening | ORAL | 1 refills | Status: DC | PRN

## 2021-01-18 NOTE — Telephone Encounter (Signed)
Prescription was sent

## 2021-01-18 NOTE — Telephone Encounter (Signed)
Pt stated yes it just gave her a headache but she can tolerate it now

## 2021-03-07 ENCOUNTER — Encounter: Payer: Self-pay | Admitting: Physician Assistant

## 2021-03-07 ENCOUNTER — Telehealth (INDEPENDENT_AMBULATORY_CARE_PROVIDER_SITE_OTHER): Payer: BC Managed Care – PPO | Admitting: Physician Assistant

## 2021-03-07 DIAGNOSIS — G4709 Other insomnia: Secondary | ICD-10-CM

## 2021-03-07 DIAGNOSIS — F325 Major depressive disorder, single episode, in full remission: Secondary | ICD-10-CM

## 2021-03-07 MED ORDER — BUPROPION HCL ER (XL) 150 MG PO TB24: 150.0000 mg | ORAL_TABLET | Freq: Every day | ORAL | 1 refills | Status: DC

## 2021-03-07 NOTE — Progress Notes (Signed)
[facility]  Patient ID: Megan Cook,  MRN: [id number]  PCP: Ranae Pila, MD  Date of Evaluation: 03/07/2021 Time spent:25 minutes  Chief Complaint:  Chief Complaint   Insomnia; Depression; Follow-up     Virtual Visit via Telehealth  I connected with patient by telephone, with their informed consent, and verified patient privacy and that I am speaking with the correct person using two identifiers.  I am private, in my office and the patient is at work.  I discussed the limitations, risks, security and privacy concerns of performing an evaluation and management service by telephone  and the availability of in person appointments. I also discussed with the patient that there may be a patient responsible charge related to this service. The patient expressed understanding and agreed to proceed.   I discussed the assessment and treatment plan with the patient. The patient was provided an opportunity to ask questions and all were answered. The patient agreed with the plan and demonstrated an understanding of the instructions.   The patient was advised to call back or seek an in-person evaluation if the symptoms worsen or if the condition fails to improve as anticipated.  I provided 25 minutes of non-face-to-face time during this encounter.  HISTORY/CURRENT STATUS: HPI For routine med check.  Megan Cook is doing well.  The only problem she has at times is trouble sleeping.  She will often get sleepy around 8:30 but then will stay up and will get a second wind around 10:00.  She does not want to take anything for sleep that late because it might affect her in the morning.  But overall she is sleeping fairly well.  She does not take the trazodone every night, its really rather rare.  Patient denies loss of interest in usual activities and is able to enjoy things.  Denies decreased energy or motivation.  Appetite has not changed.  No extreme sadness, tearfulness, or  feelings of hopelessness.  Denies any changes in concentration, making decisions or remembering things.  Not having a lot of anxiety at all.  She is not taking the hydroxyzine at all.  Denies suicidal or homicidal thoughts.   Denies dizziness, syncope, seizures, numbness, tingling, tremor, tics, unsteady gait, slurred speech, confusion. Denies muscle or joint pain, stiffness, or dystonia.  Individual Medical History/ Review of Systems: Changes? :No   Past medications for mental health diagnoses include: Serzone, Zoloft didn't help, Wellbutrin, Dextrostat, Effexor made her feel weird, trazodone caused a severe headache.  Allergies: Methylisothiazolinone, Tramadol hcl, Carbocaine [mepivacaine hcl], Codeine, and Trazodone and nefazodone  Current Medications:  Current Outpatient Medications:    levothyroxine (SYNTHROID) 50 MCG tablet, Take 1 tablet (50 mcg total) by mouth daily before breakfast., Disp: 90 tablet, Rfl: 3   traZODone (DESYREL) 100 MG tablet, Take 0.5-1 tablets (50-100 mg total) by mouth at bedtime as needed for sleep., Disp: 30 tablet, Rfl: 1   buPROPion (WELLBUTRIN XL) 150 MG 24 hr tablet, Take 1 tablet (150 mg total) by mouth daily., Disp: 90 tablet, Rfl: 1 Medication Side Effects: none  Family Medical/ Social History: Changes? No  MENTAL HEALTH EXAM:  Last menstrual period 07/13/2011.There is no height or weight on file to calculate BMI.  General Appearance:  unable to assess  Eye Contact:   unable to assess  Speech:  Clear and Coherent and Normal Rate  Volume:  Normal  Mood:  Euthymic  Affect:   unable to assess  Thought Process:  Goal Directed and Descriptions of  Associations: Circumstantial  Orientation:  Full (Time, Place, and Person)  Thought Content: Logical   Suicidal Thoughts:  No  Homicidal Thoughts:  No  Memory:  WNL  Judgement:  Good  Insight:  Good  Psychomotor Activity:   unable to assess  Concentration:  Concentration: Good and Attention Span: Good   Recall:  Good  Fund of Knowledge: Good  Language: Good  Assets:  Desire for Improvement  ADL's:  Intact  Cognition: WNL  Prognosis:  Good    DIAGNOSES:    ICD-10-CM   1. Depression, major, in remission (HCC)  F32.5     2. Other insomnia  G47.09        Receiving Psychotherapy: No    RECOMMENDATIONS:  PDMP was reviewed. No red flags.  I provided 25 minutes of non-face-to-face time during this encounter, including time spent before and after the visit in records review, medical decision making, and charting.  I am glad to see her doing so well.  No changes in meds will be made. We briefly discussed sleep hygiene. Continue Trazodone 100 mg, 1/2-1 qhs prn.  Continue Wellbutrin XL 150 mg, 1 qd. Return in 6 months  Melony Overly, [place]

## 2021-03-08 DIAGNOSIS — N952 Postmenopausal atrophic vaginitis: Secondary | ICD-10-CM | POA: Diagnosis not present

## 2021-03-08 DIAGNOSIS — Z6833 Body mass index (BMI) 33.0-33.9, adult: Secondary | ICD-10-CM | POA: Diagnosis not present

## 2021-03-08 DIAGNOSIS — F329 Major depressive disorder, single episode, unspecified: Secondary | ICD-10-CM | POA: Diagnosis not present

## 2021-03-08 DIAGNOSIS — Z01419 Encounter for gynecological examination (general) (routine) without abnormal findings: Secondary | ICD-10-CM | POA: Diagnosis not present

## 2021-03-08 DIAGNOSIS — Z1231 Encounter for screening mammogram for malignant neoplasm of breast: Secondary | ICD-10-CM | POA: Diagnosis not present

## 2021-03-14 ENCOUNTER — Other Ambulatory Visit: Payer: Self-pay

## 2021-03-14 ENCOUNTER — Ambulatory Visit: Payer: BC Managed Care – PPO | Admitting: Student

## 2021-03-14 DIAGNOSIS — Z01818 Encounter for other preprocedural examination: Secondary | ICD-10-CM | POA: Diagnosis not present

## 2021-03-14 NOTE — Progress Notes (Signed)
EKG 03/14/2021: Sinus rhythm at a rate of 70 bpm.  Borderline left atrial enlargement.  Normal axis.  No evidence of ischemia or underlying injury pattern.  Patient presents for EKG prior to upcoming upcoming surgery with Dr. Henrene Pastor, will forward results accordingly.   Rayford Halsted, PA-C 03/14/2021, 1:55 PM Office: [phone]  CC: Dr. Henrene Pastor

## 2021-03-15 DIAGNOSIS — E785 Hyperlipidemia, unspecified: Secondary | ICD-10-CM | POA: Diagnosis not present

## 2021-03-15 DIAGNOSIS — E039 Hypothyroidism, unspecified: Secondary | ICD-10-CM | POA: Diagnosis not present

## 2021-03-20 ENCOUNTER — Other Ambulatory Visit: Payer: Self-pay | Admitting: Obstetrics and Gynecology

## 2021-03-20 DIAGNOSIS — Z803 Family history of malignant neoplasm of breast: Secondary | ICD-10-CM

## 2021-03-22 ENCOUNTER — Telehealth: Payer: Self-pay

## 2021-03-22 NOTE — Telephone Encounter (Signed)
That is a non-specific finding. Nothing to be concerned about especially as it does not meet criteria for left atrial enlargement.

## 2021-03-22 NOTE — Telephone Encounter (Signed)
Patient is aware 

## 2021-03-29 DIAGNOSIS — L821 Other seborrheic keratosis: Secondary | ICD-10-CM | POA: Diagnosis not present

## 2021-03-29 DIAGNOSIS — L814 Other melanin hyperpigmentation: Secondary | ICD-10-CM | POA: Diagnosis not present

## 2021-03-29 DIAGNOSIS — D485 Neoplasm of uncertain behavior of skin: Secondary | ICD-10-CM | POA: Diagnosis not present

## 2021-03-29 DIAGNOSIS — L720 Epidermal cyst: Secondary | ICD-10-CM | POA: Diagnosis not present

## 2021-03-29 DIAGNOSIS — D235 Other benign neoplasm of skin of trunk: Secondary | ICD-10-CM | POA: Diagnosis not present

## 2021-04-09 DIAGNOSIS — R609 Edema, unspecified: Secondary | ICD-10-CM | POA: Diagnosis not present

## 2021-07-04 DIAGNOSIS — H53001 Unspecified amblyopia, right eye: Secondary | ICD-10-CM | POA: Diagnosis not present

## 2021-07-04 DIAGNOSIS — H40013 Open angle with borderline findings, low risk, bilateral: Secondary | ICD-10-CM | POA: Diagnosis not present

## 2021-07-04 DIAGNOSIS — H524 Presbyopia: Secondary | ICD-10-CM | POA: Diagnosis not present

## 2021-07-04 DIAGNOSIS — H5203 Hypermetropia, bilateral: Secondary | ICD-10-CM | POA: Diagnosis not present

## 2021-08-02 DIAGNOSIS — G8929 Other chronic pain: Secondary | ICD-10-CM | POA: Diagnosis not present

## 2021-08-02 DIAGNOSIS — R262 Difficulty in walking, not elsewhere classified: Secondary | ICD-10-CM | POA: Diagnosis not present

## 2021-08-02 DIAGNOSIS — E882 Lipomatosis, not elsewhere classified: Secondary | ICD-10-CM | POA: Diagnosis not present

## 2021-08-02 DIAGNOSIS — R609 Edema, unspecified: Secondary | ICD-10-CM | POA: Diagnosis not present

## 2021-08-02 HISTORY — PX: LIPECTOMY: SHX11

## 2021-08-02 HISTORY — PX: BRACHIOPLASTY: SUR162

## 2021-08-21 ENCOUNTER — Encounter (HOSPITAL_COMMUNITY): Payer: Self-pay

## 2021-08-21 ENCOUNTER — Ambulatory Visit (HOSPITAL_COMMUNITY)
Admission: EM | Admit: 2021-08-21 | Discharge: 2021-08-21 | Disposition: A | Discharge status: Home / Self Care | Payer: BC Managed Care – PPO | Attending: Family Medicine | Admitting: Family Medicine

## 2021-08-21 DIAGNOSIS — T8149XA Infection following a procedure, other surgical site, initial encounter: Secondary | ICD-10-CM | POA: Diagnosis not present

## 2021-08-21 DIAGNOSIS — M792 Neuralgia and neuritis, unspecified: Secondary | ICD-10-CM

## 2021-08-21 MED ORDER — AMOXICILLIN-POT CLAVULANATE 875-125 MG PO TABS: 1.0000 | ORAL_TABLET | Freq: Two times a day (BID) | ORAL | 0 refills | Status: AC

## 2021-08-21 MED ORDER — IBUPROFEN 800 MG PO TABS: 800.0000 mg | ORAL_TABLET | Freq: Three times a day (TID) | ORAL | 0 refills | Status: AC | PRN

## 2021-08-21 MED ORDER — KETOROLAC TROMETHAMINE 30 MG/ML IJ SOLN
30.0000 mg | Freq: Once | INTRAMUSCULAR | Status: AC
  Administered 2021-08-21: 30 mg via INTRAMUSCULAR

## 2021-08-21 MED ORDER — PREGABALIN 25 MG PO CAPS: 25.0000 mg | ORAL_CAPSULE | Freq: Every evening | ORAL | 1 refills | Status: DC

## 2021-08-21 MED ORDER — KETOROLAC TROMETHAMINE 30 MG/ML IJ SOLN
INTRAMUSCULAR | Status: AC
  Filled 2021-08-21: qty 1

## 2021-08-21 NOTE — ED Triage Notes (Signed)
Pt presents today with nerve pain following a surgery that occurred two weeks. Pt states shooting pain under left and right arm. Pt states swelling, oozing,  and possible open incision on left arm.  ?

## 2021-08-21 NOTE — ED Provider Notes (Addendum)
?[facility] ? ? ? ?CSN: [id number] ?Arrival date & time: 08/21/21  0803 ? ? ?  ? ?History   ?Chief Complaint ?Chief Complaint  ?Patient presents with  ? nervve pain  ? ? ?HPI ?Megan Cook is a 56 y.o. female.  ? ?HPI ?Here for increased pain in her left axilla. ? ?Approximately 2 weeks ago she had plastic surgery and brachioplasty done for lipedema of her arms in [place].  She has been experiencing burning pain in either shoulder and axilla area intermittently.  Now in the last day or 2 she has had increased pain in her left axilla and then an area opened up along the incision and it has oozed some serosanguineous drainage.  No fever or chills ? ?The pain is made it hard for her to sleep. ? ?She has [facility] as a primary care office. ? ?It was difficult for her husband to change the bandage according to the plastic surgeons instructions ? ? ?Past Medical History:  ?Diagnosis Date  ? Anxiety   ? Depression   ? Heart murmur   ? Hypothyroidism due to Hashimoto's thyroiditis   ? Lipedema   ? Lymph edema   ? Substance abuse (HCC) 03/07/09  ? 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 ? ?Patient Active Problem List  ? Diagnosis Date Noted  ? MDD (major depressive disorder), recurrent severe, without psychosis (HCC) 08/08/2019  ? ADD (attention deficit disorder) 03/11/2018  ? Swelling of limb 05/18/2015  ? Hypothyroidism due to Hashimoto's thyroiditis 07/16/2011  ? DEPRESSION 04/04/2008  ? ? ?Past Surgical History:  ?Procedure Laterality Date  ? BRACHIOPLASTY Bilateral 08/02/2021  ? CESAREAN SECTION    ? left knee    ? LIPECTOMY Bilateral 08/02/2021  ? MEDIAL PARTIAL KNEE REPLACEMENT Left   ? miniscal Right   ? neck fusion    ? TONSILLECTOMY    ? TUBAL LIGATION    ? ? ?OB History   ?No obstetric history on file. ?  ? ? ? ?Home Medications   ? ?Prior to Admission medications   ?Medication Sig Start Date End Date Taking? Authorizing Provider  ?amoxicillin-clavulanate (AUGMENTIN) 875-125 MG tablet Take 1 tablet  by mouth 2 (two) times daily for 7 days. 08/21/21 08/28/21 Yes Zenia Resides, MD  ?ibuprofen (ADVIL) 800 MG tablet Take 1 tablet (800 mg total) by mouth every 8 (eight) hours as needed (pain). 08/21/21  Yes Zenia Resides, MD  ?pregabalin (LYRICA) 25 MG capsule Take 1-2 capsules (25-50 mg total) by mouth at bedtime. 08/21/21  Yes Zenia Resides, MD  ?buPROPion (WELLBUTRIN XL) 150 MG 24 hr tablet Take 1 tablet (150 mg total) by mouth daily. 03/07/21   Melony Overly T, PA-C  ?levothyroxine (SYNTHROID) 50 MCG tablet Take 1 tablet (50 mcg total) by mouth daily before breakfast. 10/14/17   Ethelda Chick, MD  ?traZODone (DESYREL) 100 MG tablet Take 0.5-1 tablets (50-100 mg total) by mouth at bedtime as needed for sleep. 01/18/21   Cherie Ouch, PA-C  ? ? ?Family History ?Family History  ?Problem Relation Age of Onset  ? Stroke Mother   ? Thyroid disease Mother   ? Anxiety disorder Mother   ? Cancer Father 13  ?     Hairy Cell Leukemia  ? Colon cancer Father 63  ? Depression Father   ? Macular degeneration Father   ? Thyroid disease Sister   ? Drug abuse Sister   ? Thyroid disease Sister   ?  Anxiety disorder Sister   ? Depression Sister   ? Cancer Sister   ?     Hairy cell leukemia  ? ? ?Social History ?Social History  ? ?Tobacco Use  ? Smoking status: Never  ? Smokeless tobacco: Never  ?Substance Use Topics  ? Alcohol use: No  ?  Alcohol/week: 0.0 standard drinks  ?  Comment: NOTE:  Pt broke sobriety on 08/08/19 see notes  ? Drug use: No  ? ? ? ?Allergies   ?Methylisothiazolinone, Tramadol hcl, Carbocaine [mepivacaine hcl], Codeine, and Trazodone and nefazodone ? ? ?Review of Systems ?Review of Systems ? ? ?Physical Exam ?Triage Vital Signs ?ED Triage Vitals  ?Enc Vitals Group  ?   BP 08/21/21 0827 (!) 155/90  ?   Pulse Rate 08/21/21 0827 98  ?   Resp 08/21/21 0827 20  ?   Temp 08/21/21 0827 99.1 ?F (37.3 ?C)  ?   Temp Source 08/21/21 0827 Oral  ?   SpO2 08/21/21 0827 95 %  ?   Weight --   ?   Height --   ?    Head Circumference --   ?   Peak Flow --   ?   Pain Score 08/21/21 0821 8  ?   Pain Loc --   ?   Pain Edu? --   ?   Excl. in GC? --   ? ?No data found. ? ?Updated Vital Signs ?BP (!) 155/90 (BP Location: Right Arm)   Pulse 98   Temp 99.1 ?F (37.3 ?C) (Oral)   Resp 20   LMP 07/13/2011   SpO2 95%  ? ?Visual Acuity ?Right Eye Distance:   ?Left Eye Distance:   ?Bilateral Distance:   ? ?Right Eye Near:   ?Left Eye Near:    ?Bilateral Near:    ? ?Physical Exam ?Vitals reviewed.  ?Constitutional:   ?   General: She is not in acute distress. ?   Appearance: She is not toxic-appearing.  ?HENT:  ?   Mouth/Throat:  ?   Mouth: Mucous membranes are moist.  ?Cardiovascular:  ?   Rate and Rhythm: Normal rate and regular rhythm.  ?Pulmonary:  ?   Effort: Pulmonary effort is normal.  ?   Breath sounds: Normal breath sounds.  ?Skin: ?   Coloration: Skin is not pale.  ?   Comments: The left axilla was examined and there is a wound dehiscence approximately 2 cm x 1 cm in the center of her left axilla.  There is some mild erythema along the rim of that wound dehiscence.  And there is some bridging yellow tissue across it.  No purulence noted at this time.  The wound hole is fairly deep.  ?Neurological:  ?   General: No focal deficit present.  ?   Mental Status: She is oriented to person, place, and time.  ?Psychiatric:     ?   Behavior: Behavior normal.  ? ? ? ?UC Treatments / Results  ?Labs ?(all labs ordered are listed, but only abnormal results are displayed) ?Labs Reviewed - No data to display ? ?EKG ? ? ?Radiology ?No results found. ? ?Procedures ?Procedures (including critical care time) ? ?Medications Ordered in UC ?Medications  ?ketorolac (TORADOL) 30 MG/ML injection 30 mg (30 mg Intramuscular Given 08/21/21 0910)  ? ? ?Initial Impression / Assessment and Plan / UC Course  ?I have reviewed the triage vital signs and the nursing notes. ? ?Pertinent labs & imaging results that were available during  my care of the patient were  reviewed by me and considered in my medical decision making (see chart for details). ? ?  ? ?She has not tolerated gabapentin in the past.  Low-dose of Lyrica or pregabalin is sent in for her to try for the neuropathic pain.  She is given a shot of Toradol and prescription for ibuprofen is sent in.  She is given the contact info for wound care.  I redressed her wound with some bacitracin ointment for the short-term.  Request made to help her find a primary care doctor ?Final Clinical Impressions(s) / UC Diagnoses  ? ?Final diagnoses:  ?Postoperative wound infection  ?Neuropathic pain  ? ? ? ?Discharge Instructions   ? ?  ? ?You have been given a shot of Toradol 30 mg today ? ?Take ibuprofen 800 mg 1 every 8 hours as needed for pain ? ?Take pregabalin/Lyrica 25 mg--1-2 nightly at bedtime ? ?Take amoxicillin-clavulanic 875 mg--1 tab twice daily with food ? ?Please call the wound care clinic when you get home today to make an appointment ? ? ? ? ?ED Prescriptions   ? ? Medication Sig Dispense Auth. Provider  ? amoxicillin-clavulanate (AUGMENTIN) 875-125 MG tablet Take 1 tablet by mouth 2 (two) times daily for 7 days. 14 tablet Tryone Kille, Janace ArisPamela K, MD  ? pregabalin (LYRICA) 25 MG capsule Take 1-2 capsules (25-50 mg total) by mouth at bedtime. 60 capsule Zenia ResidesBanister, Dreshaun Stene K, MD  ? ibuprofen (ADVIL) 800 MG tablet Take 1 tablet (800 mg total) by mouth every 8 (eight) hours as needed (pain). 21 tablet Zenia ResidesBanister, Matias Thurman K, MD  ? ?  ? ?I have reviewed the PDMP during this encounter. ?  ?Zenia ResidesBanister, Aliene Tamura K, MD ?08/21/21 [phone] ? ?  ?Zenia ResidesBanister, Lacora Folmer K, MD ?08/21/21 [phone] ? ?

## 2021-08-21 NOTE — Discharge Instructions (Addendum)
?  You have been given a shot of Toradol 30 mg today ? ?Take ibuprofen 800 mg 1 every 8 hours as needed for pain ? ?Take pregabalin/Lyrica 25 mg--1-2 nightly at bedtime ? ?Take amoxicillin-clavulanic 875 mg--1 tab twice daily with food ? ?Please call the wound care clinic when you get home today to make an appointment ? ?

## 2021-08-22 ENCOUNTER — Encounter (HOSPITAL_BASED_OUTPATIENT_CLINIC_OR_DEPARTMENT_OTHER): Payer: BC Managed Care – PPO | Attending: Physician Assistant | Admitting: Physician Assistant

## 2021-08-22 DIAGNOSIS — L98492 Non-pressure chronic ulcer of skin of other sites with fat layer exposed: Secondary | ICD-10-CM | POA: Diagnosis not present

## 2021-08-22 DIAGNOSIS — R030 Elevated blood-pressure reading, without diagnosis of hypertension: Secondary | ICD-10-CM | POA: Diagnosis not present

## 2021-08-22 DIAGNOSIS — T8131XA Disruption of external operation (surgical) wound, not elsewhere classified, initial encounter: Secondary | ICD-10-CM | POA: Insufficient documentation

## 2021-08-22 DIAGNOSIS — E882 Lipomatosis, not elsewhere classified: Secondary | ICD-10-CM | POA: Diagnosis not present

## 2021-08-22 DIAGNOSIS — X58XXXA Exposure to other specified factors, initial encounter: Secondary | ICD-10-CM | POA: Insufficient documentation

## 2021-08-22 NOTE — Progress Notes (Signed)
Megan Cook, Megan Cook ([id number]) ?Visit Report for 08/22/2021 ?Allergy List Details ?Patient Name: Date of Service: ?Megan Cook, Megan Cook 08/22/2021 12:30 PM ?Medical Record Number: [id number] ?Patient Account Number: [id number] ?Date of Birth/Sex: Treating RN: ?[birth year], Megan Cook (56 y.o. F) Deaton, Bobbi ?Primary Care Rizwan Kuyper: Belva Agee Other Clinician: ?Referring Bryleigh Ottaway: ?Treating Samyuktha Brau/Extender: Lenda Kelp ?Megan Cook, Megan Cook ?Weeks in Treatment: 0 ?Allergies ?Active Allergies ?methylisothiazolinone ?Reaction: rash ?tramadol ?Carbocaine ?trazodone ?gabapentin ?Lyrica ?Allergy Notes ?Electronic Signature(s) ?Signed: 08/22/2021 3:35:18 PM By: Fonnie Mu RN ?Entered By: Fonnie Mu on 08/22/2021 12:47:27 ?-------------------------------------------------------------------------------- ?Arrival Information Details ?Patient Name: Date of Service: ?Megan Cook, Megan Cook 08/22/2021 12:30 PM ?Medical Record Number: [id number] ?Patient Account Number: [id number] ?Date of Birth/Sex: Treating RN: ?02-Cook-Megan Cook (56 y.o. Megan Cook, Megan Cook ?Primary Care Yulieth Carrender: Belva Agee Other Clinician: ?Referring Shanekia Latella: ?Treating Semaje Kinker/Extender: Lenda Kelp ?Megan Cook, Megan Cook ?Weeks in Treatment: 0 ?Visit Information ?Patient Arrived: Ambulatory ?Arrival Time: 12:44 ?Accompanied By: husband ?Transfer Assistance: None ?Patient Identification Verified: Yes ?Secondary Verification Process Completed: Yes ?Patient Requires Transmission-Based Precautions: No ?Patient Has Alerts: No ?Electronic Signature(s) ?Signed: 08/22/2021 3:35:18 PM By: Fonnie Mu RN ?Entered By: Fonnie Mu on 08/22/2021 12:44:49 ?-------------------------------------------------------------------------------- ?Clinic Level of Care Assessment Details ?Patient Name: Date of Service: ?Megan Cook, Megan Cook 08/22/2021 12:30 PM ?Medical Record Number: [id number] ?Patient Account Number: [id number] ?Date of Birth/Sex: Treating RN: ?Megan Cook, Megan Cook (56 y.o. Megan Cook,  Megan Cook ?Primary Care Lilit Cinelli: Belva Agee Other Clinician: ?Referring Debroh Sieloff: ?Treating Leora Platt/Extender: Lenda Kelp ?Megan Cook, Megan Cook ?Weeks in Treatment: 0 ?Clinic Level of Care Assessment Items ?TOOL 3 Quantity Score ?X- 1 0 ?Use when EandM and Procedure is performed on FOLLOW-UP visit ?ASSESSMENTS - Nursing Assessment / Reassessment ?X- 1 Cook ?Reassessment of Co-morbidities (includes updates in patient status) ?X- 1 5 ?Reassessment of Adherence to Treatment Plan ?ASSESSMENTS - Wound and Skin Assessment / Reassessment ?[]  - Points for Wound Assessment can only be taken for a new wound of unknown or different etiology and a procedure is ?0 ?NOT performed to that wound ?X- 1 5 ?Simple Wound Assessment / Reassessment - one wound ?[]  - 0 ?Complex Wound Assessment / Reassessment - multiple wounds ?[]  - 0 ?Dermatologic / Skin Assessment (not related to wound area) ?ASSESSMENTS - Focused Assessment ?[]  - 0 ?Circumferential Edema Measurements - multi extremities ?[]  - 0 ?Nutritional Assessment / Counseling / Intervention ?[]  - 0 ?Lower Extremity Assessment (monofilament, tuning fork, pulses) ?[]  - 0 ?Peripheral Arterial Disease Assessment (using hand held doppler) ?ASSESSMENTS - Ostomy and/or Continence Assessment and Care ?[]  - 0 ?Incontinence Assessment and Management ?[]  - 0 ?Ostomy Care Assessment and Management (repouching, etc.) ?PROCESS - Coordination of Care ?[]  - Points for Discharge Coordination can only be taken for a new wound of unknown or different etiology and a ?0 ?procedure is NOT performed to that wound ?X- 1 15 ?Simple Patient / Family Education for ongoing care ?[]  - 0 ?Complex (extensive) Patient / Family Education for ongoing care ?X- 1 Cook ?Staff obtains Consents, Records, T Results / Process Orders ?est ?X- 1 Cook ?Staff telephones HHA, Nursing Homes / Clarify orders / etc ?[]  - 0 ?Routine Transfer to another Facility (non-emergent condition) ?[]  - 0 ?Routine Hospital Admission (non-emergent  condition) ?X- 1 15 ?New Admissions / / Ordering NPWT Apligraf, etc. ?, ?[]  - 0 ?Emergency Hospital Admission (emergent condition) ?X- 1 Cook ?Simple Discharge Coordination ?[]  - 0 ?Complex (extensive) Discharge Coordination ?PROCESS - Special Needs ?[]  - 0 ?Pediatric / Minor Patient Management ?[]  - 0 ?Isolation Patient Management ?[]  - 0 ?Hearing /  Language / Visual special needs ?[]  - 0 ?Assessment of Community assistance (transportation, D/C planning, etc.) ?[]  - 0 ?Additional assistance / Altered mentation ?[]  - 0 ?Support Surface(s) Assessment (bed, cushion, seat, etc.) ?INTERVENTIONS - Wound Cleansing / Measurement ?[]  - Points for Wound Cleaning / Measurement, Wound Dressing, Specimen Collection and Specimen taken to lab can ?0 ?only be taken for a new wound of unknown or different etiology and a procedure is NOT performed to that wound ?X- 1 5 ?Simple Wound Cleansing - one wound ?[]  - 0 ?Complex Wound Cleansing - multiple wounds ?X- 1 5 ?Wound Imaging (photographs - any number of wounds) ?[]  - 0 ?Wound Tracing (instead of photographs) ?X- 1 5 ?Simple Wound Measurement - one wound ?[]  - 0 ?Complex Wound Measurement - multiple wounds ?INTERVENTIONS - Wound Dressings ?X - Small Wound Dressing one or multiple wounds 1 Cook ?[]  - 0 ?Medium Wound Dressing one or multiple wounds ?[]  - 0 ?Large Wound Dressing one or multiple wounds ?INTERVENTIONS - Miscellaneous ?[]  - 0 ?External ear exam ?[]  - 0 ?Specimen Collection (cultures, biopsies, blood, body fluids, etc.) ?[]  - 0 ?Specimen(s) / Culture(s) sent or taken to Lab for analysis ?[]  - 0 ?Patient Transfer (multiple staff / / Similar devices) ?[]  - 0 ?Simple Staple / Suture removal (25 or less) ?[]  - 0 ?Complex Staple / Suture removal (26 or more) ?[]  - 0 ?Hypo / Hyperglycemic Management (close monitor of Blood Glucose) ?[]  - 0 ?Ankle / Brachial Index (ABI) - do not check if billed separately ?X- 1 5 ?Vital Signs ?Has the patient been  seen at the hospital within the last three years: Yes ?Total Score: 110 ?Level Of Care: New/Established - Level 3 ?Electronic Signature(s) ?Signed: 08/22/2021 3:35:18 PM By: RN ?Entered By: on 08/22/2021 13:30:13 ?-------------------------------------------------------------------------------- ?Encounter Discharge Information Details ?Patient Name: Date of Service: ?Megan Cook, Megan Cook 08/22/2021 12:30 PM ?Medical Record Number: ?Patient Account Number: ?Date of Birth/Sex: Treating RN: ?[birth year] (56 y.o. , Megan Cook ?Primary Care Savio Albrecht: Other Clinician: ?Referring Trystin Hargrove: ?Treating Johncarlo Maalouf/Extender: Nurse, adult ?Megan Cook, Megan Cook ?Weeks in Treatment: 0 ?Encounter Discharge Information Items ?Discharge Condition: Stable ?Ambulatory Status: Ambulatory ?Discharge Destination: Home ?Transportation: Private Auto ?Accompanied By: husband ?Schedule Follow-up Appointment: Yes ?Clinical Summary of Care: Patient Declined ?Electronic Signature(s) ?Signed: 08/22/2021 3:35:18 PM By: RN ?Entered By: on 08/22/2021 13:32:41 ?-------------------------------------------------------------------------------- ?Lower Extremity Assessment Details ?Patient Name: ?Date of Service: ?Megan Cook, Megan Cook 08/22/2021 12:30 PM ?Medical Record Number: Fonnie Mu ?Patient Account Number: 08/24/2021 ?Date of Birth/Sex: ?Treating RN: ?Megan [birth year] (56 y.o. [id number], Megan Cook ?Primary Care Taziyah Iannuzzi: [id number] ?Other Clinician: ?Referring Tanisia Yokley: ?Treating Chanda Laperle/Extender: 12/21/Megan Cook ?Megan Cook, Megan Cook ?Weeks in Treatment: 0 ?Electronic Signature(s) ?Signed: 08/22/2021 3:35:18 PM By: Megan Rowan RN ?Entered By: Belva Agee on 08/22/2021 12:54:17 ?-------------------------------------------------------------------------------- ?Multi-Disciplinary Care Plan Details ?Patient Name: ?Date of Service: ?Megan Cook, Megan Cook 08/22/2021  12:30 PM ?Medical Record Number: Fonnie Mu ?Patient Account Number: 08/24/2021 ?Date of Birth/Sex: ?Treating RN: ?04/[birth year]/Megan Cook (56 y.o. [id number], Megan Cook ?Primary Care Jaymes Hang: [id number] ?Other [facility]

## 2021-08-22 NOTE — Progress Notes (Signed)
TKAI, LARGE ([id number]) ?Visit Report for 08/22/2021 ?Chief Complaint Document Details ?Patient Name: Date of Service: ?Megan Cook, Megan Cook 08/22/2021 12:30 PM ?Medical Record Number: [id number] ?Patient Account Number: [id number] ?Date of Birth/Sex: Treating RN: ?[birth year] (56 y.o. [name], [name] ?Primary Care Provider: Belva Agee Other Clinician: ?Referring Provider: ?Treating Provider/Extender: Lenda Kelp ?LEGER, ELISE ?Weeks in Treatment: 0 ?Information Obtained from: Patient ?Chief Complaint ?Left Axilla Surgical Ulcer ?Electronic Signature(s) ?Signed: 08/22/2021 1:09:59 PM By: Lenda Kelp PA-C ?Entered By: Lenda Kelp on 08/22/2021 13:09:59 ?-------------------------------------------------------------------------------- ?Debridement Details ?Patient Name: Date of Service: ?Megan Cook, Megan Cook 08/22/2021 12:30 PM ?Medical Record Number: [id number] ?Patient Account Number: [id number] ?Date of Birth/Sex: Treating RN: ?1966/01/15 (56 y.o. [name], [name] ?Primary Care Provider: Belva Agee Other Clinician: ?Referring Provider: ?Treating Provider/Extender: Lenda Kelp ?LEGER, ELISE ?Weeks in Treatment: 0 ?Debridement Performed for Assessment: Wound #1 Left Axilla ?Performed By: Physician Lenda Kelp, PA ?Debridement Type: Debridement ?Level of Consciousness (Pre-procedure): Awake and Alert ?Pre-procedure Verification/Time Out Yes - 13:30 ?Taken: ?Start Time: 13:30 ?Pain Control: Lidocaine ?T Area Debrided (L x W): ?otal 2 (cm) x 1.5 (cm) = 3 (cm?) ?Tissue and other material debrided: Viable, Non-Viable, Slough, Subcutaneous, Slough ?Level: Skin/Subcutaneous Tissue ?Debridement Description: Excisional ?Instrument: Curette ?Bleeding: Minimum ?Hemostasis Achieved: Pressure ?End Time: 13:30 ?Procedural Pain: 0 ?Post Procedural Pain: 0 ?Response to Treatment: Procedure was tolerated well ?Level of Consciousness (Post- Awake and Alert ?procedure): ?Post Debridement Measurements of Total  Wound ?Length: (cm) 2 ?Width: (cm) 1.5 ?Depth: (cm) 0.5 ?Volume: (cm?) 1.178 ?Character of Wound/Ulcer Post Debridement: Improved ?Post Procedure Diagnosis ?Same as Pre-procedure ?Electronic Signature(s) ?Signed: 08/22/2021 3:35:18 PM By: Fonnie Mu RN ?Signed: 08/22/2021 5:06:09 PM By: Lenda Kelp PA-C ?Entered By: Fonnie Mu on 08/22/2021 13:48:35 ?-------------------------------------------------------------------------------- ?HPI Details ?Patient Name: Date of Service: ?Megan Cook, Megan Cook 08/22/2021 12:30 PM ?Medical Record Number: [id number] ?Patient Account Number: [id number] ?Date of Birth/Sex: Treating RN: ?12-03-1965 (56 y.o. [name], [name] ?Primary Care Provider: Belva Agee Other Clinician: ?Referring Provider: ?Treating Provider/Extender: Lenda Kelp ?LEGER, ELISE ?Weeks in Treatment: 0 ?History of Present Illness ?HPI Description: 08/22/2021 patient presents for initial inspection here in our clinic today secondary to an issue that she is having in the left axillary region this ?is a wound dehiscence after Lipedema surgery. This was actually done in [place] therefore is not easy for her to get back to the surgeon. He is aware of ?what is going on however. There is a small area that has opened about the size of a nickel roughly. There is some necrotic tissue noted right at the base of the ?wound but fortunately this does not appear to be too bad overall. I think that she is going to require a little bit of debridement but overall I think that we can ?probably do quite well in getting this area to heal effectively. Unfortunately she is having quite a bit of discomfort she is in rehab due to substance abuse in ?the past. She is doing very good as far as that is concerned. She keeps in touch with her sponsor and overall has been very effective in the program it ?sounds like based on the limited discussion we had today. Nonetheless she states this has been a barrier to getting pain  control medication obviously which is ?a little bit of an issue due to the fact that she is having some significant pain here. The patient does have high blood pressure today but no history of ?hypertension. Again this may be  just secondary more to pain that she is experiencing. She is on Augmentin as prescribed by urgent care. ?Electronic Signature(s) ?Signed: 08/22/2021 2:42:59 PM By: Lenda Kelp PA-C ?Entered By: Lenda Kelp on 08/22/2021 14:42:59 ?-------------------------------------------------------------------------------- ?Physical Exam Details ?Patient Name: Date of Service: ?Megan Cook, Megan Cook 08/22/2021 12:30 PM ?Medical Record Number: [id number] ?Patient Account Number: [id number] ?Date of Birth/Sex: Treating RN: ?July 18, 1965 (56 y.o. [name], [name] ?Primary Care Provider: Belva Agee Other Clinician: ?Referring Provider: ?Treating Provider/Extender: Lenda Kelp ?LEGER, ELISE ?Weeks in Treatment: 0 ?Constitutional ?patient is hypertensive.. pulse regular and within target range for patient.Marland Kitchen respirations regular, non-labored and within target range for patient.Marland Kitchen temperature ?within target range for patient.. Well-nourished and well-hydrated in no acute distress. ?Eyes ?conjunctiva clear no eyelid edema noted. pupils equal round and reactive to light and accommodation. ?Ears, Nose, Mouth, and Throat ?no gross abnormality of ear auricles or external auditory canals. normal hearing noted during conversation. mucus membranes moist. ?Respiratory ?normal breathing without difficulty. ?Musculoskeletal ?normal gait and posture. no significant deformity or arthritic changes, no loss or range of motion, no clubbing. ?Psychiatric ?this patient is able to make decisions and demonstrates good insight into disease process. Alert and Oriented x 3. pleasant and cooperative. ?Notes ?Upon inspection patient's wound bed actually showed signs of having some necrotic tissue in the base of the wound which was  noted today. With that being ?said I do not see any evidence of active infection at this time which is great news. I did perform sharp debridement to clear away the slough from the surface ?of the wound down to good subcutaneous tissue. She tolerated that today without complication postdebridement the wound bed appears to be doing much better ?which is great news. ?Electronic Signature(s) ?Signed: 08/22/2021 2:43:45 PM By: Lenda Kelp PA-C ?Entered By: Lenda Kelp on 08/22/2021 14:43:44 ?-------------------------------------------------------------------------------- ?Physician Orders Details ?Patient Name: ?Date of Service: ?Megan Cook, Megan Cook 08/22/2021 12:30 PM ?Medical Record Number: [id number] ?Patient Account Number: [id number] ?Date of Birth/Sex: ?Treating RN: ?Nov 02, 1965 (56 y.o. [name], [name] ?Primary Care Provider: Belva Agee ?Other Clinician: ?Referring Provider: ?Treating Provider/Extender: Lenda Kelp ?LEGER, ELISE ?Weeks in Treatment: 0 ?Verbal / Phone Orders: No ?Diagnosis Coding ?ICD-10 Coding ?Code Description ?T81.31XA Disruption of external operation (surgical) wound, not elsewhere classified, initial encounter ?E88.2 Lipomatosis, not elsewhere classified ?L98.492 Non-pressure chronic ulcer of skin of other sites with fat layer exposed ?R03.0 Elevated blood-pressure reading, without diagnosis of hypertension ?Follow-up Appointments ?ppointment in 1 week. Leonard Schwartz and [facility] Room # 9 ?Return A ?Bathing/ Shower/ Hygiene ?May shower with protection but do not get wound dressing(s) wet. ?Wound Treatment ?Wound #1 - Axilla Wound Laterality: Left ?Cleanser: Soap and Water 1 x Per Day/15 Days ?Discharge Instructions: May shower and wash wound with dial antibacterial soap and water prior to dressing change. ?Cleanser: Wound Cleanser (DME) (Generic) 1 x Per Day/15 Days ?Discharge Instructions: Cleanse the wound with wound cleanser prior to applying a clean dressing using gauze sponges, not  tissue or cotton balls. ?Prim Dressing: Hydrofera Blue Classic Foam, 4x4 in (DME) (Generic) 1 x Per Day/15 Days ?ary ?Discharge Instructions: Moisten with saline prior to applying to wound bed ?Secondary Dre

## 2021-08-22 NOTE — Progress Notes (Signed)
GALAXY, BORDEN ([id number]) ?Visit Report for 08/22/2021 ?Abuse Risk Screen Details ?Patient Name: Date of Service: ?Megan Cook, Megan Cook 08/22/2021 12:30 PM ?Medical Record Number: [id number] ?Patient Account Number: [id number] ?Date of Birth/Sex: Treating RN: ?1966/04/11 (56 y.o. [name], [name] ?Primary Care Pelham Hennick: Belva Agee Other Clinician: ?Referring Abdulla Pooley: ?Treating Aleysha Meckler/Extender: Lenda Kelp ?LEGER, ELISE ?Weeks in Treatment: 0 ?Abuse Risk Screen Items ?Answer ?ABUSE RISK SCREEN: ?Has anyone close to you tried to hurt or harm you recentlyo No ?Do you feel uncomfortable with anyone in your familyo No ?Has anyone forced you do things that you didnt want to doo No ?Electronic Signature(s) ?Signed: 08/22/2021 3:35:18 PM By: Fonnie Mu RN ?Entered By: Fonnie Mu on 08/22/2021 12:52:02 ?-------------------------------------------------------------------------------- ?Activities of Daily Living Details ?Patient Name: Date of Service: ?Megan Cook, Megan Cook 08/22/2021 12:30 PM ?Medical Record Number: [id number] ?Patient Account Number: [id number] ?Date of Birth/Sex: Treating RN: ?09-16-1965 (56 y.o. [name], [name] ?Primary Care Zalaya Astarita: Belva Agee Other Clinician: ?Referring Redina Zeller: ?Treating Quanika Solem/Extender: Lenda Kelp ?LEGER, ELISE ?Weeks in Treatment: 0 ?Activities of Daily Living Items ?Answer ?Activities of Daily Living (Please select one for each item) ?Drive Automobile Completely Able ?T Medications ?ake Completely Able ?Use T elephone Completely Able ?Care for Appearance Completely Able ?Use T oilet Completely Able ?Bath / Shower Completely Able ?Dress Self Completely Able ?Feed Self Completely Able ?Walk Completely Able ?Get In / Out Bed Completely Able ?Housework Completely Able ?Prepare Meals Completely Able ?Handle Money Completely Able ?Shop for Self Completely Able ?Electronic Signature(s) ?Signed: 08/22/2021 3:35:18 PM By: Fonnie Mu RN ?Entered By:  Fonnie Mu on 08/22/2021 12:52:26 ?-------------------------------------------------------------------------------- ?Education Screening Details ?Patient Name: ?Date of Service: ?Megan Cook, Megan Cook 08/22/2021 12:30 PM ?Medical Record Number: [id number] ?Patient Account Number: [id number] ?Date of Birth/Sex: ?Treating RN: ?26-Jan-1966 (56 y.o. [name], [name] ?Primary Care Tramya Schoenfelder: Belva Agee ?Other Clinician: ?Referring Thanvi Blincoe: ?Treating Tesla Bochicchio/Extender: Lenda Kelp ?LEGER, ELISE ?Weeks in Treatment: 0 ?Primary Learner Assessed: Patient ?Learning Preferences/Education Level/Primary Language ?Learning Preference: Explanation, Demonstration, Communication Board, Printed Material ?Highest Education Level: College or Above ?Preferred Language: English ?Cognitive Barrier ?Language Barrier: No ?Translator Needed: No ?Memory Deficit: No ?Emotional Barrier: No ?Cultural/Religious Beliefs Affecting Medical Care: No ?Physical Barrier ?Impaired Vision: Yes Glasses ?Impaired Hearing: No ?Decreased Hand dexterity: No ?Knowledge/Comprehension ?Knowledge Level: High ?Comprehension Level: High ?Ability to understand written instructions: High ?Ability to understand verbal instructions: High ?Motivation ?Anxiety Level: Calm ?Cooperation: Cooperative ?Education Importance: Denies Need ?Interest in Health Problems: Asks Questions ?Perception: Coherent ?Willingness to Engage in Self-Management High ?Activities: ?Readiness to Engage in Self-Management High ?Activities: ?Electronic Signature(s) ?Signed: 08/22/2021 3:35:18 PM By: Fonnie Mu RN ?Entered By: Fonnie Mu on 08/22/2021 12:53:23 ?-------------------------------------------------------------------------------- ?Fall Risk Assessment Details ?Patient Name: ?Date of Service: ?Megan Cook, Megan Cook 08/22/2021 12:30 PM ?Medical Record Number: [id number] ?Patient Account Number: [id number] ?Date of Birth/Sex: ?Treating RN: ?05-Jun-1965 (56 y.o. [name],  [name] ?Primary Care Horris Speros: Belva Agee ?Other Clinician: ?Referring Aseneth Hack: ?Treating Binyamin Nelis/Extender: Lenda Kelp ?LEGER, ELISE ?Weeks in Treatment: 0 ?Fall Risk Assessment Items ?Have you had 2 or more falls in the last 12 monthso 0 No ?Have you had any fall that resulted in injury in the last 12 monthso 0 No ?FALLS RISK SCREEN ?History of falling - immediate or within 3 months 0 No ?Secondary diagnosis (Do you have 2 or more medical diagnoseso) 0 No ?Ambulatory aid ?None/bed rest/wheelchair/nurse 0 No ?Crutches/cane/walker 0 No ?Furniture 0 No ?Intravenous therapy Access/Saline/Heparin Lock 0 No ?Gait/Transferring ?Normal/ bed rest/ wheelchair 0 No ?Weak (short steps  with or without shuffle, stooped but able to lift head while walking, may seek 0 No ?support from furniture) ?Impaired (short steps with shuffle, may have difficulty arising from chair, head down, impaired 0 No ?balance) ?Mental Status ?Oriented to own ability 0 No ?Electronic Signature(s) ?Signed: 08/22/2021 3:35:18 PM By: Fonnie Mu RN ?Entered By: Fonnie Mu on 08/22/2021 12:53:32 ?-------------------------------------------------------------------------------- ?Foot Assessment Details ?Patient Name: ?Date of Service: ?Megan Cook, Megan Cook 08/22/2021 12:30 PM ?Medical Record Number: [id number] ?Patient Account Number: [id number] ?Date of Birth/Sex: ?Treating RN: ?[birth year] (56 y.o. [name], [name] ?Primary Care Cassie Shedlock: Belva Agee ?Other Clinician: ?Referring Jerusha Reising: ?Treating Yosselin Zoeller/Extender: Lenda Kelp ?LEGER, ELISE ?Weeks in Treatment: 0 ?Foot Assessment Items ?Site Locations ?+ = Sensation present, - = Sensation absent, C = Callus, U = Ulcer ?R = Redness, W = Warmth, M = Maceration, PU = Pre-ulcerative lesion ?F = Fissure, S = Swelling, D = Dryness ?Assessment ?Right: Left: ?Other Deformity: No No ?Prior Foot Ulcer: No No ?Prior Amputation: No No ?Charcot Joint: No No ?Ambulatory Status: ?Gait: ?Electronic  Signature(s) ?Signed: 08/22/2021 3:35:18 PM By: Fonnie Mu RN ?Entered By: Fonnie Mu on 08/22/2021 12:54:07 ?-------------------------------------------------------------------------------- ?Nutrition Risk Screening Details ?Patient Name: ?Date of Service: ?Megan Cook, Megan Cook 08/22/2021 12:30 PM ?Medical Record Number: [id number] ?Patient Account Number: [id number] ?Date of Birth/Sex: ?Treating RN: ?02-02-1966 (56 y.o. [name], [name] ?Primary Care Zhuri Krass: Belva Agee ?Other Clinician: ?Referring Khalaya Mcgurn: ?Treating Mahi Zabriskie/Extender: Lenda Kelp ?LEGER, ELISE ?Weeks in Treatment: 0 ?Height (in): 67 ?Weight (lbs): 202 ?Body Mass Index (BMI): 31.6 ?Nutrition Risk Screening Items ?Score Screening ?NUTRITION RISK SCREEN: ?I have an illness or condition that made me change the kind and/or amount of food I eat 0 No ?I eat fewer than two meals per day 0 No ?I eat few fruits and vegetables, or milk products 0 No ?I have three or more drinks of beer, liquor or wine almost every day 0 No ?I have tooth or mouth problems that make it hard for me to eat 0 No ?I don't always have enough money to buy the food I need 0 No ?I eat alone most of the time 0 No ?I take three or more different prescribed or over-the-counter drugs a day 0 No ?Without wanting to, I have lost or gained 10 pounds in the last six months 0 No ?I am not always physically able to shop, cook and/or feed myself 0 No ?Nutrition Protocols ?Good Risk Protocol 0 No interventions needed ?Moderate Risk Protocol ?High Risk Proctocol ?Risk Level: Good Risk ?Score: 0 ?Electronic Signature(s) ?Signed: 08/22/2021 3:35:18 PM By: Fonnie Mu RN ?Entered By: Fonnie Mu on 08/22/2021 12:53:40 ?

## 2021-08-23 ENCOUNTER — Encounter (HOSPITAL_COMMUNITY): Payer: Self-pay | Admitting: Emergency Medicine

## 2021-08-23 ENCOUNTER — Ambulatory Visit (HOSPITAL_COMMUNITY)
Admission: EM | Admit: 2021-08-23 | Discharge: 2021-08-23 | Disposition: A | Discharge status: Home / Self Care | Payer: BC Managed Care – PPO | Attending: Nurse Practitioner | Admitting: Nurse Practitioner

## 2021-08-23 ENCOUNTER — Other Ambulatory Visit: Payer: Self-pay

## 2021-08-23 DIAGNOSIS — M792 Neuralgia and neuritis, unspecified: Secondary | ICD-10-CM | POA: Diagnosis not present

## 2021-08-23 DIAGNOSIS — T8131XA Disruption of external operation (surgical) wound, not elsewhere classified, initial encounter: Secondary | ICD-10-CM | POA: Diagnosis not present

## 2021-08-23 MED ORDER — HYDROCODONE-ACETAMINOPHEN 5-325 MG PO TABS: 1.0000 | ORAL_TABLET | Freq: Four times a day (QID) | ORAL | 0 refills | Status: AC | PRN

## 2021-08-23 MED ORDER — HYDROCODONE-ACETAMINOPHEN 5-325 MG PO TABS: 1.0000 | ORAL_TABLET | Freq: Four times a day (QID) | ORAL | 0 refills | Status: DC | PRN

## 2021-08-23 NOTE — ED Triage Notes (Signed)
Pt presents to UC for a follow up relating to nerve pain. States she has nerve pain after a surgery and the Lyrica she was prescribed gives headaches and does not relieve the pain.  ?

## 2021-08-23 NOTE — Discharge Instructions (Addendum)
-   [facility] [address]  Please use this sparingly and as we discussed. Continue collaboration with Wound Clinic. ?

## 2021-08-23 NOTE — ED Provider Notes (Signed)
?[facility] ? ? ? ?CSN: [id number] ?Arrival date & time: 08/23/21  1714 ? ? ?  ? ?History   ?Chief Complaint ?Chief Complaint  ?Patient presents with  ? Follow-up  ? Nerve Pain  ? ? ?HPI ?Megan Cook is a 56 y.o. female.  ? ?Patient presents today for follow-up of pain.  She was seen at the wound clinic earlier this week and had some debridement of her left axilla wound.  She reports today at work, her right axilla wound started draining and she is worried it may be dehisced as well.  She is continuing to have a severe neuropathic pain in both arms.  She is taking the Augmentin as prescribed.  She reports the Lyrica gave her headache and she is not able to tolerate it.  She has been working this week and has been in severe pain, unable to sleep.  The Toradol injection given earlier this week did not relieve any pain.  She denies fevers, nausea/vomiting.  She has an appointment with the wound care clinic in 5 days. ? ?Patient has a history of substance abuse and currently has support at home including her husband, friend, AA, and sponsor. ? ? ?Past Medical History:  ?Diagnosis Date  ? Anxiety   ? Depression   ? Heart murmur   ? Hypothyroidism due to Hashimoto's thyroiditis   ? Lipedema   ? Lymph edema   ? Substance abuse (HCC) 03/07/09  ? 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 ? ?Patient Active Problem List  ? Diagnosis Date Noted  ? MDD (major depressive disorder), recurrent severe, without psychosis (HCC) 08/08/2019  ? ADD (attention deficit disorder) 03/11/2018  ? Swelling of limb 05/18/2015  ? Hypothyroidism due to Hashimoto's thyroiditis 07/16/2011  ? DEPRESSION 04/04/2008  ? ? ?Past Surgical History:  ?Procedure Laterality Date  ? BRACHIOPLASTY Bilateral 08/02/2021  ? CESAREAN SECTION    ? left knee    ? LIPECTOMY Bilateral 08/02/2021  ? MEDIAL PARTIAL KNEE REPLACEMENT Left   ? miniscal Right   ? neck fusion    ? TONSILLECTOMY    ? TUBAL LIGATION    ? ? ?OB History   ?No obstetric history on  file. ?  ? ? ? ?Home Medications   ? ?Prior to Admission medications   ?Medication Sig Start Date End Date Taking? Authorizing Provider  ?amoxicillin-clavulanate (AUGMENTIN) 875-125 MG tablet Take 1 tablet by mouth 2 (two) times daily for 7 days. 08/21/21 08/28/21  Zenia Resides, MD  ?buPROPion (WELLBUTRIN XL) 150 MG 24 hr tablet Take 1 tablet (150 mg total) by mouth daily. 03/07/21   Melony Overly T, PA-C  ?HYDROcodone-acetaminophen (NORCO/VICODIN) 5-325 MG tablet Take 1 tablet by mouth every 6 (six) hours as needed for up to 5 days for severe pain or moderate pain. 08/23/21 08/28/21  Valentino Nose, NP  ?ibuprofen (ADVIL) 800 MG tablet Take 1 tablet (800 mg total) by mouth every 8 (eight) hours as needed (pain). 08/21/21   Zenia Resides, MD  ?levothyroxine (SYNTHROID) 50 MCG tablet Take 1 tablet (50 mcg total) by mouth daily before breakfast. 10/14/17   Ethelda Chick, MD  ?pregabalin (LYRICA) 25 MG capsule Take 1-2 capsules (25-50 mg total) by mouth at bedtime. 08/21/21   Zenia Resides, MD  ?traZODone (DESYREL) 100 MG tablet Take 0.5-1 tablets (50-100 mg total) by mouth at bedtime as needed for sleep. 01/18/21   Cherie Ouch, PA-C  ? ? ?Family History ?Family  History  ?Problem Relation Age of Onset  ? Stroke Mother   ? Thyroid disease Mother   ? Anxiety disorder Mother   ? Cancer Father 69  ?     Hairy Cell Leukemia  ? Colon cancer Father 29  ? Depression Father   ? Macular degeneration Father   ? Thyroid disease Sister   ? Drug abuse Sister   ? Thyroid disease Sister   ? Anxiety disorder Sister   ? Depression Sister   ? Cancer Sister   ?     Hairy cell leukemia  ? ? ?Social History ?Social History  ? ?Tobacco Use  ? Smoking status: Never  ? Smokeless tobacco: Never  ?Substance Use Topics  ? Alcohol use: No  ?  Alcohol/week: 0.0 standard drinks  ?  Comment: NOTE:  Pt broke sobriety on 08/08/19 see notes  ? Drug use: No  ? ? ? ?Allergies   ?Methylisothiazolinone, Tramadol hcl, Carbocaine [mepivacaine  hcl], Codeine, and Trazodone and nefazodone ? ? ?Review of Systems ?Review of Systems ?Per HPI ? ?Physical Exam ?Triage Vital Signs ?ED Triage Vitals [08/23/21 1836]  ?Enc Vitals Group  ?   BP (!) 156/108  ?   Pulse Rate 80  ?   Resp 20  ?   Temp 98.3 ?F (36.8 ?C)  ?   Temp Source Oral  ?   SpO2 98 %  ?   Weight 208 lb 15.9 oz (94.8 kg)  ?   Height 5\' 7"  (1.702 m)  ?   Head Circumference   ?   Peak Flow   ?   Pain Score 10  ?   Pain Loc   ?   Pain Edu?   ?   Excl. in GC?   ? ?No data found. ? ?Updated Vital Signs ?BP (!) 156/108 (BP Location: Right Arm)   Pulse 80   Temp 98.3 ?F (36.8 ?C) (Oral)   Resp 20   Ht 5\' 7"  (1.702 m)   Wt 208 lb 15.9 oz (94.8 kg)   LMP 07/13/2011   SpO2 98%   BMI 32.73 kg/m?  ? ?Visual Acuity ?Right Eye Distance:   ?Left Eye Distance:   ?Bilateral Distance:   ? ?Right Eye Near:   ?Left Eye Near:    ?Bilateral Near:    ? ?Physical Exam ?Vitals and nursing note reviewed.  ?Constitutional:   ?   General: She is not in acute distress. ?   Appearance: Normal appearance. She is not toxic-appearing.  ?Pulmonary:  ?   Effort: Pulmonary effort is normal. No respiratory distress.  ?Neurological:  ?   Mental Status: She is alert and oriented to person, place, and time.  ?   Motor: No weakness.  ?   Gait: Gait normal.  ?Psychiatric:     ?   Mood and Affect: Mood normal.     ?   Behavior: Behavior normal.  ? ? ? ?UC Treatments / Results  ?Labs ?(all labs ordered are listed, but only abnormal results are displayed) ?Labs Reviewed - No data to display ? ?EKG ? ? ?Radiology ?No results found. ? ?Procedures ?Procedures (including critical care time) ? ?Medications Ordered in UC ?Medications - No data to display ? ?Initial Impression / Assessment and Plan / UC Course  ?I have reviewed the triage vital signs and the nursing notes. ? ?Pertinent labs & imaging results that were available during my care of the patient were reviewed by me and considered in my  medical decision making (see chart for  details). ? ?  ?Patient remains in severe pain despite multiple alternate modalities including pregabalin, NSAIDs, Tylenol.  We discussed her history of substance abuse and patient feels that she is in a very supportive environment currently.  She was able to take Vicodin very responsibly the recent surgery .  I feel this is appropriate to prescribe again for a short amount of time.  PDMP reviewed and shows she has not been prescribed Vicodin in a few months which is appropriate with what the patient tells me.  We discussed that her husband will manage the pain medication and she will continue to go to AA meetings and check with her sponsor during this time.  Continue collaboration with wound care.  A referral to primary care has been placed previously. ? ?[facility] [address] [facility] [place], however they called back saying the pharmacy is out of this medication.  The [facility] was then [facility] to [facility] [address] in [place]. The original prescription was cancelled. ?Final Clinical Impressions(s) / UC Diagnoses  ? ?Final diagnoses:  ?Neuropathic pain  ? ? ? ?Discharge Instructions   ? ?  ?- [facility] [address]  Please use this sparingly and as we discussed. Continue collaboration with Wound Clinic. ? ? ?ED Prescriptions   ? ? Medication Sig Dispense Auth. Provider  ? HYDROcodone-acetaminophen (NORCO/VICODIN) 5-325 MG tablet  (Status: Discontinued) Take 1 tablet by mouth every 6 (six) hours as needed for up to 5 days for severe pain or moderate pain. 20 tablet Cathlean MarseillesMartinez, Nivaan Dicenzo A, NP  ? HYDROcodone-acetaminophen (NORCO/VICODIN) 5-325 MG tablet Take 1 tablet by mouth every 6 (six) hours as needed for up to 5 days for severe pain or moderate pain. 20 tablet Valentino NoseMartinez, Devonn Giampietro A, NP  ? ?  ? ?I have reviewed the PDMP during this encounter. ?  ?Valentino NoseMartinez, Genevieve Ritzel A, NP ?08/23/21 2101 ? ?

## 2021-08-28 ENCOUNTER — Encounter: Payer: Self-pay | Admitting: Obstetrics and Gynecology

## 2021-08-29 ENCOUNTER — Encounter (HOSPITAL_BASED_OUTPATIENT_CLINIC_OR_DEPARTMENT_OTHER): Payer: BC Managed Care – PPO | Attending: Physician Assistant | Admitting: Physician Assistant

## 2021-08-29 DIAGNOSIS — E882 Lipomatosis, not elsewhere classified: Secondary | ICD-10-CM | POA: Diagnosis not present

## 2021-08-29 DIAGNOSIS — Y838 Other surgical procedures as the cause of abnormal reaction of the patient, or of later complication, without mention of misadventure at the time of the procedure: Secondary | ICD-10-CM | POA: Diagnosis not present

## 2021-08-29 DIAGNOSIS — R030 Elevated blood-pressure reading, without diagnosis of hypertension: Secondary | ICD-10-CM | POA: Insufficient documentation

## 2021-08-29 DIAGNOSIS — T8131XA Disruption of external operation (surgical) wound, not elsewhere classified, initial encounter: Secondary | ICD-10-CM | POA: Diagnosis not present

## 2021-08-29 DIAGNOSIS — L98492 Non-pressure chronic ulcer of skin of other sites with fat layer exposed: Secondary | ICD-10-CM | POA: Insufficient documentation

## 2021-08-29 NOTE — Progress Notes (Addendum)
CATHRYNE, MANCEBO ([id number]) ?Visit Report for 08/29/2021 ?Chief Complaint Document Details ?Patient Name: Date of Service: ?TKAI, LARGE 08/29/2021 2:45 PM ?Medical Record Number: [id number] ?Patient Account Number: [id number] ?Date of Birth/Sex: Treating RN: ?June 02, 1965 (56 y.o. [name], [name] ?Primary Care Provider: Belva Agee Other Clinician: ?Referring Provider: ?Treating Provider/Extender: Lenda Kelp ?Belva Agee ?Weeks in Treatment: 1 ?Information Obtained from: Patient ?Chief Complaint ?Left Axilla Surgical Ulcer ?Electronic Signature(s) ?Signed: 08/29/2021 3:09:50 PM By: Lenda Kelp PA-C ?Entered By: Lenda Kelp on 08/29/2021 15:09:50 ?-------------------------------------------------------------------------------- ?HPI Details ?Patient Name: Date of Service: ?ARABELLE, BOLLIG 08/29/2021 2:45 PM ?Medical Record Number: [id number] ?Patient Account Number: [id number] ?Date of Birth/Sex: Treating RN: ?1966-01-13 (56 y.o. 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 This was actually done in [place] therefore is not easy for her to get back to the surgeon. He is aware of ?what is going on however. There is a small area that has opened about the size of a nickel roughly. There is some necrotic tissue noted right at the base of the ?wound but fortunately this does not appear to be too bad overall. I think that she is going to require a little bit of debridement but overall I think that we can ?probably do quite well in getting this area to heal effectively. Unfortunately she is having quite a bit of discomfort she is in rehab  due to substance abuse in ?the past. She is doing very good as far as that is concerned. She keeps in touch with her sponsor and overall has been very effective in the program it ?sounds like based on the limited discussion we had today. Nonetheless she states this has been a barrier to getting pain control medication obviously which is ?a little bit of an issue due to the fact that she is having some significant pain here. The patient does have high blood pressure today but no history of ?hypertension. Again this may be just secondary more to pain that she is experiencing. She is on Augmentin as prescribed by urgent care. ?08-29-2021 upon evaluation today patient appears to be doing a little better in regard to the original wound for which I saw her and the left axillary region. ?Unfortunately she has an area on the right axillary region as well which is new. Obviously this is good news is definitely not what we are looking for here. ?Fortunately I do not however see any signs of active infection at this time which is great news. ?Electronic Signature(s) ?Signed: 08/29/2021 5:11:50 PM By: Lenda Kelp PA-C ?Entered By: Lenda Kelp on 08/29/2021 17:11:50 ?-------------------------------------------------------------------------------- ?Physical Exam Details ?Patient Name: Date of Service: ?MARKEE, REMLINGER 08/29/2021 2:45 PM ?Medical Record Number: [id number] ?Patient Account Number: [id number] ?Date of Birth/Sex: Treating RN: ?1966-01-31 (56 y.o. [name], [name] ?Primary Care Provider: Belva Agee Other Clinician: ?Referring Provider: ?Treating Provider/Extender: Lenda Kelp ?Belva Agee ?Weeks in Treatment: 1 ?Constitutional ?Well-nourished and well-hydrated in no acute distress. ?Respiratory ?normal breathing without difficulty. ?Psychiatric ?this patient is able to make decisions and demonstrates good insight into disease process. Alert and Oriented x 3. pleasant and cooperative. ?Notes ?Upon  inspection patient's wound bed actually showed  signs again of some decrease in the hypergranulation. Unfortunately the [facility] is stated to ?have been getting stuck to significantly she had the classic not the ready that was sent to her. With that being said I am not extremely happy with what we are ?seeing here in that regard I think that the patient would benefit from a switch of dressing to something like a collagen based dressing to hopefully try to see ?some improvement here as well. That was discussed with the patient and her husband today. ?Electronic Signature(s) ?Signed: 08/29/2021 5:12:36 PM By: Lenda Kelp PA-C ?Entered By: Lenda Kelp on 08/29/2021 17:12:36 ?-------------------------------------------------------------------------------- ?Physician Orders Details ?Patient Name: Date of Service: ?JOURDIN, GENS 08/29/2021 2:45 PM ?Medical Record Number: [id number] ?Patient Account Number: [id number] ?Date of Birth/Sex: Treating RN: ?1966-02-20 (56 y.o. 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 Leonard Schwartz and [facility] Room # 9 ?Return A ?Bathing/ Shower/ Hygiene ?May shower with protection but do not get wound dressing(s) wet. ?Wound Treatment ?Wound #1 - Axilla Wound Laterality: Left ?Cleanser: Soap and Water 1 x Per Day/15 Days ?Discharge Instructions: May shower and wash wound with dial antibacterial soap and water prior to dressing  change. ?Cleanser: Wound Cleanser (DME) (Generic) 1 x Per Day/15 Days ?Discharge Instructions: Cleanse the wound with wound cleanser prior to applying a clean dressing using gauze sponges, not tissue or cotton balls. ?Prim Dressing: Promogran Prisma Matrix, 4.34 (sq in) (silver collagen) (DME) (Generic) 1 x Per Day/15 Days ?ary ?Discharge Instructions: Moisten collagen with saline or hydrogel ?Secondary Dressing: Woven Gauze Sponge, Non-Sterile 4x4 in (DME) (Generic) 1 x Per Day/15 Days ?Discharge Instructions: Apply over primary dressing as directed. ?Secondary Dressing: allevyn 5x5 foam dressing (DME) (Dispense As Written) 1 x Per Day/15 Days ?Wound #2 - Axilla Wound Laterality: Right ?Cleanser: Soap and Water 1 x Per Day/15 Days ?Discharge Instructions: May shower and wash wound with dial antibacterial soap and water prior to dressing change. ?Cleanser: Wound Cleanser (DME) (Generic) 1 x Per Day/15 Days ?Discharge Instructions: Cleanse the wound with wound cleanser prior to applying a clean dressing using gauze sponges, not tissue or cotton balls. ?Prim Dressing: Promogran Prisma Matrix, 4.34 (sq in) (silver collagen) (DME) (Generic) 1 x Per Day/15 Days ?ary ?Discharge Instructions: Moisten collagen with saline or hydrogel ?Secondary Dressing: Woven Gauze Sponge, Non-Sterile 4x4 in (DME) (Generic) 1 x Per Day/15 Days ?Discharge Instructions: Apply over primary dressing as directed. ?Secondary Dressing: allevyn 4x4 foam dressing (DME) (Dispense As Written) 1 x Per Day/15 Days ?Electronic Signature(s) ?Signed: 08/29/2021 5:59:40 PM By: Lenda Kelp PA-C ?Signed: 09/17/2021 12:30:28 PM By: Fonnie Mu RN ?Entered By: Fonnie Mu on 08/29/2021 16:23:31 ?-------------------------------------------------------------------------------- ?Problem List Details ?Patient Name: ?Date of Service: ?LIVIYA, SANTINI 08/29/2021 2:45 PM ?Medical Record Number: [id number] ?Patient Account Number: [id number] ?Date of  Birth/Sex: ?Treating RN: ?02/04/1966 (56 y.o. [name], [name] ?Primary Care Provider: Belva Agee ?Other Clinician: ?Referring Provider: ?Treating Provider/Extender: Lenda Kelp ?Belva Agee ?Weeks in Treatment

## 2021-08-30 DIAGNOSIS — T8131XA Disruption of external operation (surgical) wound, not elsewhere classified, initial encounter: Secondary | ICD-10-CM | POA: Diagnosis not present

## 2021-09-05 ENCOUNTER — Encounter (HOSPITAL_BASED_OUTPATIENT_CLINIC_OR_DEPARTMENT_OTHER): Payer: BC Managed Care – PPO | Admitting: Physician Assistant

## 2021-09-05 DIAGNOSIS — Y838 Other surgical procedures as the cause of abnormal reaction of the patient, or of later complication, without mention of misadventure at the time of the procedure: Secondary | ICD-10-CM | POA: Diagnosis not present

## 2021-09-05 DIAGNOSIS — L98495 Non-pressure chronic ulcer of skin of other sites with muscle involvement without evidence of necrosis: Secondary | ICD-10-CM | POA: Diagnosis not present

## 2021-09-05 DIAGNOSIS — R030 Elevated blood-pressure reading, without diagnosis of hypertension: Secondary | ICD-10-CM | POA: Diagnosis not present

## 2021-09-05 DIAGNOSIS — E882 Lipomatosis, not elsewhere classified: Secondary | ICD-10-CM | POA: Diagnosis not present

## 2021-09-05 DIAGNOSIS — L98492 Non-pressure chronic ulcer of skin of other sites with fat layer exposed: Secondary | ICD-10-CM | POA: Diagnosis not present

## 2021-09-05 DIAGNOSIS — T8131XA Disruption of external operation (surgical) wound, not elsewhere classified, initial encounter: Secondary | ICD-10-CM | POA: Diagnosis not present

## 2021-09-05 NOTE — Progress Notes (Addendum)
Megan Cook, Megan S. ([id number]) ?Visit Report for 09/05/2021 ?Chief Complaint Document Details ?Patient Name: Date of Service: ?Megan Cook, Megan S. 09/05/2021 2:45 PM ?Medical Record Number: [id number] ?Patient Account Number: [id number] ?Date of Birth/Sex: Treating RN: ?06-23-1965 52(55 y.o. Megan RowanF) Cook, Megan ?Primary Care Provider: Belva Cook, Megan Other Clinician: ?Referring Provider: ?Treating Provider/Extender: Megan KelpStone III, Megan Cook ?Megan Cook, Megan ?Weeks in Treatment: 2 ?Information Obtained from: Patient ?Chief Complaint ?Left Axilla Surgical Ulcer ?Electronic Signature(s) ?Signed: 09/05/2021 3:28:38 PM By: Megan KelpStone III, Megan Cullens PA-C ?Entered By: Megan KelpStone III, Charman Blasco on 09/05/2021 15:28:38 ?-------------------------------------------------------------------------------- ?HPI Details ?Patient Name: Date of Service: ?Megan Cook, Megan S. 09/05/2021 2:45 PM ?Medical Record Number: [id number] ?Patient Account Number: [id number] ?Date of Birth/Sex: Treating RN: ?06-23-1965 57(55 y.o. 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 [place] therefore is not easy for her to get back to the surgeon. He is aware of ?what is going on however. There is a small area that has opened about the size of a nickel roughly. There is some necrotic tissue noted right at the base of the ?wound but fortunately this does not appear to be too bad overall. I think that she is going to require a little bit of debridement but overall I think that we can ?probably do quite well in getting this area to heal effectively. Unfortunately she is having quite a bit of discomfort she is in  rehab due to substance abuse in ?the past. She is doing very good as far as that is concerned. She keeps in touch with her sponsor and overall has been very effective in the program it ?sounds like based on the limited discussion we had today. Nonetheless she states this has been a barrier to getting pain control medication obviously which is ?a little bit of an issue due to the fact that she is having some significant pain here. The patient does have high blood pressure today but no history of ?hypertension. Again this may be just secondary more to pain that she is experiencing. She is on Augmentin as prescribed by urgent care. ?08-29-2021 upon evaluation today patient appears to be doing a little better in regard to the original wound for which I saw her and the left axillary region. ?Unfortunately she has an area on the right axillary region as well which is new. Obviously this is good news is definitely not what we are looking for here. ?Fortunately I do not however see any signs of active infection at this time which is great news. ?09-05-2021 upon evaluation today patient appears to be doing well with regard to her wounds. Fortunately she is not showing any signs of active infection at this ?time. No fevers, chills, nausea, vomiting, or diarrhea. Fortunately I do believe she is making good progress here from the standpoint of her wounds. Both on ?the left and the right the left is definitely doing much better but was never is large to begin with. I think the collagen is appropriate to continue at this point. The ?biggest concern is we been having difficult time getting the right dressings in place she does have a lot of allergies and sensitivities and unfortunately the ?version of Allevyn that she received was  not the right version. She needs the gentle border that someone that does not bother her. ?Electronic Signature(s) ?Signed: 09/05/2021 4:35:04 PM By: Megan Kelp PA-C ?Entered By: Megan Cook on  09/05/2021 16:35:04 ?-------------------------------------------------------------------------------- ?Physical Exam Details ?Patient Name: Date of Service: ?Megan Cook, Megan Cook 09/05/2021 2:45 PM ?Medical Record Number: [id number] ?Patient Account Number: [id number] ?Date of Birth/Sex: Treating RN: ?1965-11-15 (56 y.o. Megan Cook, Megan ?Primary Care Provider: Belva Agee Other Clinician: ?Referring Provider: ?Treating Provider/Extender: Megan Cook ?Megan Agee ?Weeks in Treatment: 2 ?Constitutional ?Well-nourished and well-hydrated in no acute distress. ?Respiratory ?normal breathing without difficulty. ?Psychiatric ?this patient is able to make decisions and demonstrates good insight into disease process. Alert and Oriented x 3. pleasant and cooperative. ?Notes ?Upon inspection patient's wound bed actually showed signs again of good granulation and epithelization no sharp debridement was necessary and overall all 3 ?wound areas seem to be making good progress. I am very pleased in that regard. ?Electronic Signature(s) ?Signed: 09/05/2021 4:35:26 PM By: Megan Kelp PA-C ?Entered By: Megan Cook on 09/05/2021 16:35:26 ?-------------------------------------------------------------------------------- ?Physician Orders Details ?Patient Name: Date of Service: ?Megan Cook, Megan Cook 09/05/2021 2:45 PM ?Medical Record Number: [id number] ?Patient Account Number: [id number] ?Date of Birth/Sex: Treating RN: ?1966/01/03 (56 y.o. 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 Leonard Schwartz and [facility] Room # 9 ?Return A ?Bathing/ Shower/ Hygiene ?May shower with protection but do not get wound dressing(s) wet. ?Wound Treatment ?Wound #1 - Axilla Wound Laterality: Left ?Cleanser: Soap and Water 1 x Per Day/15 Days ?Discharge Instructions: May shower and wash wound with dial antibacterial soap and water prior to dressing change. ?Cleanser: Wound Cleanser (Generic) 1 x Per Day/15 Days ?Discharge Instructions: Cleanse the wound with wound cleanser prior to applying a clean dressing using gauze sponges, not tissue or cotton balls. ?Prim Dressing: Promogran Prisma Matrix, 4.34 (sq in) (silver collagen) (Generic) 1 x Per Day/15 Days ?ary ?Discharge Instructions: Moisten collagen with saline or hydrogel ?Secondary Dressing: Woven Gauze Sponge, Non-Sterile 4x4 in (Generic) 1 x Per Day/15 Days ?Discharge Instructions: Apply over primary dressing as directed. ?Secondary Dressing: ALLEVYN Heel 4 1/2in x 5 1/2in / 10.5cm x 13.5cm (DME) (Dispense As Written) 1 x Per Day/15 Days ?Discharge Instructions: Apply over primary dressing as directed. ?Secondary Dressing: allevyn 5x5 gentle border foam dressing (DME) (Dispense As Written) 1 x Per Day/15 Days ?Wound #2 - Axilla Wound Laterality: Right ?Cleanser: Soap and Water 1 x Per Day/15 Days ?Discharge Instructions: May shower and wash wound with dial antibacterial soap and water prior to dressing change. ?Cleanser: Wound Cleanser (Generic) 1 x Per Day/15 Days ?Discharge Instructions: Cleanse the wound with wound cleanser prior to applying a clean dressing using gauze sponges, not tissue or cotton balls. ?Prim Dressing: Promogran Prisma Matrix, 4.34 (sq in) (silver collagen) (Generic) 1 x Per Day/15 Days ?ary ?Discharge Instructions: Moisten collagen with saline or hydrogel ?Secondary Dressing: Woven Gauze Sponge, Non-Sterile 4x4 in (Generic) 1 x Per  Day/15 Days ?Discharge Instructions: Apply over primary dressing as directed. ?Secondary Dressing: ALLEVYN Heel 4 1/2in x 5 1/2in / 10.5cm  x 13.5cm (DME) (Dispense As Written) 1 x Per Day/15 Days ?Discharge Inst

## 2021-09-07 NOTE — Progress Notes (Signed)
Megan, Cook ([id number]) ?Visit Report for 09/05/2021 ?Arrival Information Details ?Patient Name: Date of Service: ?Megan Cook, Megan Cook 09/05/2021 2:45 PM ?Medical Record Number: [id number] ?Patient Account Number: [id number] ?Date of Birth/Sex: Treating RN: ?1966-01-18 (56 y.o. Megan Cook, Megan Cook ?Primary Care Jamesha Ellsworth: Megan Cook Other Clinician: ?Referring Misao Fackrell: ?Treating Shantana Christon/Extender: Lenda Kelp ?Megan Cook ?Weeks in Treatment: 2 ?Visit Information History Since Last Visit ?Added or deleted any medications: No ?Patient Arrived: Ambulatory ?Any new allergies or adverse reactions: No ?Arrival Time: 15:27 ?Had a fall or experienced change in No ?Accompanied By: self ?activities of daily living that may affect ?Transfer Assistance: None ?risk of falls: ?Patient Identification Verified: Yes ?Signs or symptoms of abuse/neglect since last visito No ?Secondary Verification Process Completed: Yes ?Hospitalized since last visit: No ?Patient Requires Transmission-Based Precautions: No ?Implantable device outside of the clinic excluding No ?Patient Has Alerts: No ?cellular tissue based products placed in the center ?since last visit: ?Has Dressing in Place as Prescribed: Yes ?Pain Present Now: No ?Electronic Signature(s) ?Signed: 09/07/2021 12:54:41 PM By: Megan Mu RN ?Entered By: Megan Cook on 09/05/2021 15:29:02 ?-------------------------------------------------------------------------------- ?Clinic Level of Care Assessment Details ?Patient Name: Date of Service: ?Megan Cook, Megan Cook 09/05/2021 2:45 PM ?Medical Record Number: [id number] ?Patient Account Number: [id number] ?Date of Birth/Sex: Treating RN: ?31-Aug-1965 (56 y.o. Megan Cook, Megan Cook ?Primary Care Megan Cook: Megan Cook Other Clinician: ?Referring Megan Cook: ?Treating Megan Cook/Extender: Lenda Kelp ?Megan Cook ?Weeks in Treatment: 2 ?Clinic Level of Care Assessment Items ?TOOL 4 Quantity Score ?X- 1 0 ?Use when only an EandM  is performed on FOLLOW-UP visit ?ASSESSMENTS - Nursing Assessment / Reassessment ?X- 1 10 ?Reassessment of Co-morbidities (includes updates in patient status) ?X- 1 5 ?Reassessment of Adherence to Treatment Plan ?ASSESSMENTS - Wound and Skin A ssessment / Reassessment ?[]  - 0 ?Simple Wound Assessment / Reassessment - one wound ?X- 2 5 ?Complex Wound Assessment / Reassessment - multiple wounds ?X- 1 10 ?Dermatologic / Skin Assessment (not related to wound area) ?ASSESSMENTS - Focused Assessment ?[]  - 0 ?Circumferential Edema Measurements - multi extremities ?[]  - 0 ?Nutritional Assessment / Counseling / Intervention ?[]  - 0 ?Lower Extremity Assessment (monofilament, tuning fork, pulses) ?[]  - 0 ?Peripheral Arterial Disease Assessment (using hand held doppler) ?ASSESSMENTS - Ostomy and/or Continence Assessment and Care ?[]  - 0 ?Incontinence Assessment and Management ?[]  - 0 ?Ostomy Care Assessment and Management (repouching, etc.) ?PROCESS - Coordination of Care ?[]  - 0 ?Simple Patient / Family Education for ongoing care ?X- 1 20 ?Complex (extensive) Patient / Family Education for ongoing care ?X- 1 10 ?Staff obtains Consents, Records, T Results / Process Orders ?est ?[]  - 0 ?Staff telephones HHA, Nursing Homes / Clarify orders / etc ?[]  - 0 ?Routine Transfer to another Facility (non-emergent condition) ?[]  - 0 ?Routine Hospital Admission (non-emergent condition) ?[]  - 0 ?New Admissions / / Ordering NPWT Apligraf, etc. ?, ?[]  - 0 ?Emergency Hospital Admission (emergent condition) ?[]  - 0 ?Simple Discharge Coordination ?X- 1 15 ?Complex (extensive) Discharge Coordination ?PROCESS - Special Needs ?[]  - 0 ?Pediatric / Minor Patient Management ?[]  - 0 ?Isolation Patient Management ?[]  - 0 ?Hearing / Language / Visual special needs ?[]  - 0 ?Assessment of Community assistance (transportation, D/C planning, etc.) ?[]  - 0 ?Additional assistance / Altered mentation ?[]  - 0 ?Support Surface(s)  Assessment (bed, cushion, seat, etc.) ?INTERVENTIONS - Wound Cleansing / Measurement ?[]  - 0 ?Simple Wound Cleansing - one wound ?X- 2 5 ?Complex Wound Cleansing - multiple wounds ?X- 1 5 ?Wound  Imaging (photographs - any number of wounds) ?[]  - 0 ?Wound Tracing (instead of photographs) ?[]  - 0 ?Simple Wound Measurement - one wound ?X- 2 5 ?Complex Wound Measurement - multiple wounds ?INTERVENTIONS - Wound Dressings ?X - Small Wound Dressing one or multiple wounds 2 10 ?[]  - 0 ?Medium Wound Dressing one or multiple wounds ?[]  - 0 ?Large Wound Dressing one or multiple wounds ?[]  - 0 ?Application of Medications - topical ?[]  - 0 ?Application of Medications - injection ?INTERVENTIONS - Miscellaneous ?[]  - 0 ?External ear exam ?[]  - 0 ?Specimen Collection (cultures, biopsies, blood, body fluids, etc.) ?[]  - 0 ?Specimen(s) / Culture(s) sent or taken to Lab for analysis ?[]  - 0 ?Patient Transfer (multiple staff / / Similar devices) ?[]  - 0 ?Simple Staple / Suture removal (25 or less) ?[]  - 0 ?Complex Staple / Suture removal (26 or more) ?[]  - 0 ?Hypo / Hyperglycemic Management (close monitor of Blood Glucose) ?[]  - 0 ?Ankle / Brachial Index (ABI) - do not check if billed separately ?X- 1 5 ?Vital Signs ?Has the patient been seen at the hospital within the last three years: Yes ?Total Score: 130 ?Level Of Care: New/Established - Level 4 ?Electronic Signature(s) ?Signed: 09/07/2021 12:54:41 PM By: RN ?Entered By: on 09/05/2021 16:21:51 ?-------------------------------------------------------------------------------- ?Encounter Discharge Information Details ?Patient Name: Date of Service: ?Megan Cook, Megan Cook 09/05/2021 2:45 PM ?Medical Record Number: ?Patient Account Number: ?Date of Birth/Sex: Treating RN: ?1966/04/04 (56 y.o. , Megan Cook ?Primary Care Megan Cook: Other Clinician: ?Referring Megan Cook: ?Treating Megan Cook/Extender: ? ?Weeks in Treatment: 2 ?Encounter Discharge Information Items ?Discharge Condition: Stable ?Ambulatory Status: Ambulatory ?Discharge Destination: Home ?Transportation: Private Auto ?Accompanied By: husbandsss ?Schedule Follow-up Appointment: Yes ?Clinical Summary of Care: Patient Declined ?Electronic Signature(s) ?Signed: 09/07/2021 12:54:41 PM By: Megan Mu RN ?Entered By: Megan Cook on 09/05/2021 16:11:47 ?-------------------------------------------------------------------------------- ?Lower Extremity Assessment Details ?Patient Name: Date of Service: ?Megan Cook, Megan Cook 09/05/2021 2:45 PM ?Medical Record Number: [id number] ?Patient Account Number: [id number] ?Date of Birth/Sex: Treating RN: ?1965-11-28 (56 y.o. Megan Cook, Megan Cook ?Primary Care Binta Statzer: Megan Cook Other Clinician: ?Referring Godric Lavell: ?Treating Aarohi Redditt/Extender: Lenda Kelp ?Megan Cook ?Weeks in Treatment: 2 ?Electronic Signature(s) ?Signed: 09/07/2021 12:54:41 PM By: Megan Mu RN ?Entered By: Megan Cook on 09/05/2021 16:06:22 ?-------------------------------------------------------------------------------- ?Multi-Disciplinary Care Plan Details ?Patient Name: Date of Service: ?Megan Cook, Megan Cook 09/05/2021 2:45 PM ?Medical Record Number: [id number] ?Patient Account Number: [id number] ?Date of Birth/Sex: ?Treating RN: ?[birth year] (56 y.o. Megan Cook, Megan Cook ?Primary Care Genola Yuille: Megan Cook ?Other Clinician: ?Referring Philippa Vessey: ?Treating Renia Mikelson/Extender: Lenda Kelp ?Megan Cook ?Weeks in Treatment: 2 ?Active Inactive ?Orientation to the Wound Care Program ?Nursing Diagnoses: ?Knowledge deficit related to the wound healing center program ?Goals: ?Patient/caregiver will verbalize understanding of the Wound Healing Center Program ?Date Initiated: 08/22/2021 ?Target Resolution Date: 09/22/2021 ?Goal Status: Active ?Interventions: ?Provide education on orientation to the wound  center ?Notes: ?Wound/Skin Impairment ?Nursing Diagnoses: ?Impaired tissue integrity ?Knowledge deficit related to ulceration/compromised skin integrity ?Goals: ?Patient will have a decrease in wound volume by X% from date

## 2021-09-12 ENCOUNTER — Encounter (HOSPITAL_BASED_OUTPATIENT_CLINIC_OR_DEPARTMENT_OTHER): Payer: BC Managed Care – PPO | Admitting: Physician Assistant

## 2021-09-12 DIAGNOSIS — E882 Lipomatosis, not elsewhere classified: Secondary | ICD-10-CM | POA: Diagnosis not present

## 2021-09-12 DIAGNOSIS — L98492 Non-pressure chronic ulcer of skin of other sites with fat layer exposed: Secondary | ICD-10-CM | POA: Diagnosis not present

## 2021-09-12 DIAGNOSIS — Y838 Other surgical procedures as the cause of abnormal reaction of the patient, or of later complication, without mention of misadventure at the time of the procedure: Secondary | ICD-10-CM | POA: Diagnosis not present

## 2021-09-12 DIAGNOSIS — T8131XA Disruption of external operation (surgical) wound, not elsewhere classified, initial encounter: Secondary | ICD-10-CM | POA: Diagnosis not present

## 2021-09-12 DIAGNOSIS — R030 Elevated blood-pressure reading, without diagnosis of hypertension: Secondary | ICD-10-CM | POA: Diagnosis not present

## 2021-09-12 NOTE — Progress Notes (Addendum)
[name], [name] ([id number]) ?Visit Report for 09/12/2021 ?Chief Complaint Document Details ?Patient Name: Date of Service: ?Megan Cook, Megan S. 09/12/2021 8:00 A M ?Medical Record Number: [id number] ?Patient Account Number: [id number] ?Date of Birth/Sex: Treating RN: ?06-07-1965 (56 y.o. [name], [name] ?Primary Care Provider: Belva Agee Other Clinician: ?Referring Provider: ?Treating Provider/Extender: Lenda Kelp ?Belva Agee ?Weeks in Treatment: 3 ?Information Obtained from: Patient ?Chief Complaint ?Left Axilla Surgical Ulcer ?Electronic Signature(s) ?Signed: 09/12/2021 8:10:55 AM By: Lenda Kelp PA-C ?Entered By: Lenda Kelp on 09/12/2021 08:10:55 ?-------------------------------------------------------------------------------- ?HPI Details ?Patient Name: Date of Service: ?Megan Cook, Megan S. 09/12/2021 8:00 A M ?Medical Record Number: [id number] ?Patient Account Number: [id number] ?Date of Birth/Sex: Treating RN: ?December 22, 1965 (56 y.o. [name], [name] ?Primary Care Provider: Belva Agee Other Clinician: ?Referring Provider: ?Treating Provider/Extender: Lenda Kelp ?Belva Agee ?Weeks in Treatment: 3 ?History of Present Illness ?HPI Description: 08/22/2021 patient presents for initial inspection here in our clinic today secondary to an issue that she is having in the left axillary region this ?is a wound dehiscence after Lipedema surgery. This was actually done in [place] therefore is not easy for her to get back to the surgeon. He is aware of ?what is going on however. There is a small area that has opened about the size of a nickel roughly. There is some necrotic tissue noted right at the base of the ?wound but fortunately this does not appear to be too bad overall. I think that she is going to require a little bit of debridement but overall I think that we can ?probably do quite well in getting this area to heal effectively. Unfortunately she is having quite a bit of discomfort she is in  rehab due to substance abuse in ?the past. She is doing very good as far as that is concerned. She keeps in touch with her sponsor and overall has been very effective in the program it ?sounds like based on the limited discussion we had today. Nonetheless she states this has been a barrier to getting pain control medication obviously which is ?a little bit of an issue due to the fact that she is having some significant pain here. The patient does have high blood pressure today but no history of ?hypertension. Again this may be just secondary more to pain that she is experiencing. She is on Augmentin as prescribed by urgent care. ?08-29-2021 upon evaluation today patient appears to be doing a little better in regard to the original wound for which I saw her and the left axillary region. ?Unfortunately she has an area on the right axillary region as well which is new. Obviously this is good news is definitely not what we are looking for here. ?Fortunately I do not however see any signs of active infection at this time which is great news. ?09-05-2021 upon evaluation today patient appears to be doing well with regard to her wounds. Fortunately she is not showing any signs of active infection at this ?time. No fevers, chills, nausea, vomiting, or diarrhea. Fortunately I do believe she is making good progress here from the standpoint of her wounds. Both on ?the left and the right the left is definitely doing much better but was never is large to begin with. I think the collagen is appropriate to continue at this point. The ?biggest concern is we been having difficult time getting the right dressings in place she does have a lot of allergies and sensitivities and unfortunately the ?version of Allevyn that she  received was not the right version. She needs the gentle border that someone that does not bother her. ?09-12-2021 upon evaluation today patient appears to be doing well with regard to her wounds. Unfortunately even the  Allevyn dressings were causing her to ?break out. She is down to just using gauze and paper tape which I think is okay as well. Fortunately I do not see any signs of active infection locally or ?systemically I do believe there is a little bit of new epithelial growth as well and the wounds do seem to be getting a little bit better overall from a size ?perspective. I do not see any signs of active infection locally nor systemically which is great news. ?Electronic Signature(s) ?Signed: 09/12/2021 8:46:32 AM By: Lenda Kelp PA-C ?Entered By: Lenda Kelp on 09/12/2021 08:46:32 ?-------------------------------------------------------------------------------- ?Physical Exam Details ?Patient Name: Date of Service: ?Megan Cook, Megan S. 09/12/2021 8:00 A M ?Medical Record Number: [id number] ?Patient Account Number: [id number] ?Date of Birth/Sex: Treating RN: ?1965/08/13 (56 y.o. [name], [name] ?Primary Care Provider: Belva Agee Other Clinician: ?Referring Provider: ?Treating Provider/Extender: Lenda Kelp ?Belva Agee ?Weeks in Treatment: 3 ?Constitutional ?Well-nourished and well-hydrated in no acute distress. ?Respiratory ?normal breathing without difficulty. ?Psychiatric ?this patient is able to make decisions and demonstrates good insight into disease process. Alert and Oriented x 3. pleasant and cooperative. ?Notes ?Upon inspection patient's wound bed actually showed signs of good Granulation and epithelization at this point. Fortunately any signs of anything worsening ?which is also great news. I believe that the patient is making excellent progress here and I do not see there is definitely no infection I just think this is going to ?be very slow to heal due to the location and the fact that she is having to use compression I think this is causing things to be a little bit more difficult from a ?healing perspective. ?Electronic Signature(s) ?Signed: 09/12/2021 8:47:18 AM By: Lenda Kelp PA-C ?Entered  By: Lenda Kelp on 09/12/2021 08:47:18 ?-------------------------------------------------------------------------------- ?Physician Orders Details ?Patient Name: Date of Service: ?Megan Cook, Megan S. 09/12/2021 8:00 A M ?Medical Record Number: [id number] ?Patient Account Number: [id number] ?Date of Birth/Sex: Treating RN: ?November 04, 1965 (56 y.o. Roel Cluck ?Primary Care Provider: Belva Agee Other Clinician: ?Referring Provider: ?Treating Provider/Extender: Lenda Kelp ?Belva Agee ?Weeks in Treatment: 3 ?Verbal / Phone Orders: No ?Diagnosis Coding ?ICD-10 Coding ?Code Description ?T81.31XA Disruption of external operation (surgical) wound, not elsewhere classified, initial encounter ?E88.2 Lipomatosis, not elsewhere classified ?L98.492 Non-pressure chronic ulcer of skin of other sites with fat layer exposed ?R03.0 Elevated blood-pressure reading, without diagnosis of hypertension ?Follow-up Appointments ?ppointment in 1 week. Leonard Schwartz and [facility] Room # 9 ?Return A ?Bathing/ Shower/ Hygiene ?May shower with protection but do not get wound dressing(s) wet. ?Edema Control - Lymphedema / SCD / Other ?Other Edema Control Orders/Instructions: - patient to wear compression garment ?Wound Treatment ?Wound #1 - Axilla Wound Laterality: Left ?Cleanser: Normal Saline (DME) (Generic) Every Other Day/30 Days ?Discharge Instructions: Cleanse the wound with Normal Saline prior to applying a clean dressing using gauze sponges, not tissue or cotton balls. ?Cleanser: Soap and Water Every Other Day/30 Days ?Discharge Instructions: May shower and wash wound with dial antibacterial soap and water prior to dressing change. ?Cleanser: Wound Cleanser (Generic) Every Other Day/30 Days ?Discharge Instructions: Cleanse the wound with wound cleanser prior to applying a clean dressing using gauze sponges, not tissue or cotton balls. ?Prim Dressing: Promogran Prisma Matrix, 4.34 (sq in) (silver collagen) (DME) (  Generic) Every Other  Day/30 Days ?ary ?Discharge Instructions: Moisten collagen with saline or hydrogel ?Secondary Dressing: Woven Gauze Sponge, Non-Sterile 4x4 in (DME) (Generic) Every Other Day/30 Days ?Discharge Instructi

## 2021-09-12 NOTE — Progress Notes (Addendum)
Megan Cook, Megan Cook ([id number]) ?Visit Report for 09/12/2021 ?Arrival Information Details ?Patient Name: Date of Service: ?Megan Cook, Megan S. 09/12/2021 8:00 A M ?Medical Record Number: [id number] ?Patient Account Number: [id number] ?Date of Birth/Sex: Treating RN: ?20-May-1966 (56 y.o. Female) Antonieta Iba ?Primary Care Drago Hammonds: Belva Agee Other Clinician: ?Referring Nayleah Gamel: ?Treating Jillene Wehrenberg/Extender: Lenda Kelp ?Belva Agee ?Weeks in Treatment: 3 ?Visit Information History Since Last Visit ?Added or deleted any medications: No ?Patient Arrived: Ambulatory ?Any new allergies or adverse reactions: No ?Arrival Time: 08:04 ?Had a fall or experienced change in No ?Transfer Assistance: None ?activities of daily living that may affect ?Patient Identification Verified: Yes ?risk of falls: ?Secondary Verification Process Completed: Yes ?Signs or symptoms of abuse/neglect since last visito No ?Patient Requires Transmission-Based Precautions: No ?Hospitalized since last visit: No ?Patient Has Alerts: No ?Implantable device outside of the clinic excluding No ?cellular tissue based products placed in the center ?since last visit: ?Has Dressing in Place as Prescribed: Yes ?Pain Present Now: Yes ?Electronic Signature(s) ?Signed: 09/12/2021 8:28:19 AM By: Antonieta Iba ?Entered By: Antonieta Iba on 09/12/2021 08:28:19 ?-------------------------------------------------------------------------------- ?Clinic Level of Care Assessment Details ?Patient Name: Date of Service: ?Megan Cook, Megan S. 09/12/2021 8:00 A M ?Medical Record Number: [id number] ?Patient Account Number: [id number] ?Date of Birth/Sex: Treating RN: ?1965/07/21 (56 y.o. Female) Antonieta Iba ?Primary Care Koleton Duchemin: Belva Agee Other Clinician: ?Referring Narcissa Melder: ?Treating Demico Ploch/Extender: Lenda Kelp ?Belva Agee ?Weeks in Treatment: 3 ?Clinic Level of Care Assessment Items ?TOOL 4 Quantity Score ?X- 1 0 ?Use when only an EandM is performed on FOLLOW-UP  visit ?ASSESSMENTS - Nursing Assessment / Reassessment ?X- 1 10 ?Reassessment of Co-morbidities (includes updates in patient status) ?X- 1 5 ?Reassessment of Adherence to Treatment Plan ?ASSESSMENTS - Wound and Skin A ssessment / Reassessment ?[]  - 0 ?Simple Wound Assessment / Reassessment - one wound ?X- 2 5 ?Complex Wound Assessment / Reassessment - multiple wounds ?[]  - 0 ?Dermatologic / Skin Assessment (not related to wound area) ?ASSESSMENTS - Focused Assessment ?[]  - 0 ?Circumferential Edema Measurements - multi extremities ?[]  - 0 ?Nutritional Assessment / Counseling / Intervention ?[]  - 0 ?Lower Extremity Assessment (monofilament, tuning fork, pulses) ?[]  - 0 ?Peripheral Arterial Disease Assessment (using hand held doppler) ?ASSESSMENTS - Ostomy and/or Continence Assessment and Care ?[]  - 0 ?Incontinence Assessment and Management ?[]  - 0 ?Ostomy Care Assessment and Management (repouching, etc.) ?PROCESS - Coordination of Care ?[]  - 0 ?Simple Patient / Family Education for ongoing care ?X- 1 20 ?Complex (extensive) Patient / Family Education for ongoing care ?X- 1 10 ?Staff obtains Consents, Records, T Results / Process Orders ?est ?[]  - 0 ?Staff telephones HHA, Nursing Homes / Clarify orders / etc ?[]  - 0 ?Routine Transfer to another Facility (non-emergent condition) ?[]  - 0 ?Routine Hospital Admission (non-emergent condition) ?[]  - 0 ?New Admissions / / Ordering NPWT Apligraf, etc. ?, ?[]  - 0 ?Emergency Hospital Admission (emergent condition) ?[]  - 0 ?Simple Discharge Coordination ?[]  - 0 ?Complex (extensive) Discharge Coordination ?PROCESS - Special Needs ?[]  - 0 ?Pediatric / Minor Patient Management ?[]  - 0 ?Isolation Patient Management ?[]  - 0 ?Hearing / Language / Visual special needs ?[]  - 0 ?Assessment of Community assistance (transportation, D/C planning, etc.) ?[]  - 0 ?Additional assistance / Altered mentation ?[]  - 0 ?Support Surface(s) Assessment (bed, cushion, seat,  etc.) ?INTERVENTIONS - Wound Cleansing / Measurement ?[]  - 0 ?Simple Wound Cleansing - one wound ?X- 2 5 ?Complex Wound Cleansing - multiple wounds ?X- 1 5 ?Wound Imaging (photographs -  any number of wounds) ?[]  - 0 ?Wound Tracing (instead of photographs) ?[]  - 0 ?Simple Wound Measurement - one wound ?X- 2 5 ?Complex Wound Measurement - multiple wounds ?INTERVENTIONS - Wound Dressings ?X - Small Wound Dressing one or multiple wounds 2 10 ?[]  - 0 ?Medium Wound Dressing one or multiple wounds ?[]  - 0 ?Large Wound Dressing one or multiple wounds ?[]  - 0 ?Application of Medications - topical ?[]  - 0 ?Application of Medications - injection ?INTERVENTIONS - Miscellaneous ?[]  - 0 ?External ear exam ?[]  - 0 ?Specimen Collection (cultures, biopsies, blood, body fluids, etc.) ?[]  - 0 ?Specimen(s) / Culture(s) sent or taken to Lab for analysis ?[]  - 0 ?Patient Transfer (multiple staff / / Similar devices) ?[]  - 0 ?Simple Staple / Suture removal (25 or less) ?[]  - 0 ?Complex Staple / Suture removal (26 or more) ?[]  - 0 ?Hypo / Hyperglycemic Management (close monitor of Blood Glucose) ?[]  - 0 ?Ankle / Brachial Index (ABI) - do not check if billed separately ?X- 1 5 ?Vital Signs ?Has the patient been seen at the hospital within the last three years: Yes ?Total Score: 105 ?Level Of Care: New/Established - Level 3 ?Electronic Signature(s) ?Signed: 09/12/2021 4:31:55 PM By: ?Entered By: on 09/12/2021 08:41:53 ?-------------------------------------------------------------------------------- ?Encounter Discharge Information Details ?Patient Name: Date of Service: ?Megan Cook, Megan S. 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 ?Weeks in Treatment:  3 ?Encounter Discharge Information Items ?Discharge Condition: Stable ?Ambulatory Status: Ambulatory ?Discharge Destination: Home ?Transportation: Private Auto ?Schedule Follow-up Appointment: Yes ?Clinical Summary of Care: Provided on 09/12/2021 ?Form Type Recipient ?Paper Patient Patient ?Electronic Signature(s) ?Signed: 09/12/2021 4:31:55 PM By: Antonieta Iba ?Entered By: Antonieta Iba on 09/12/2021 09:07:19 ?-------------------------------------------------------------------------------- ?Lower Extremity Assessment Details ?Patient Name: Date of Service: ?Megan Cook, Megan S. 09/12/2021 8:00 A M ?Medical Record Number: [id number] ?Patient Account Number: [id number] ?Date of Birth/Sex: Treating RN: ?Dec 28, 1965 (56 y.o. Female) Antonieta Iba ?Primary Care Nasario Czerniak: Belva Agee Other Clinician: ?Referring Marshun Duva: ?Treating Carols Clemence/Extender: Lenda Kelp ?Belva Agee ?Weeks in Treatment: 3 ?Electronic Signature(s) ?Signed: 09/12/2021 4:31:55 PM By: 09/14/2021 ?Entered By: Antonieta Iba on 09/12/2021 08:08:44 ?-------------------------------------------------------------------------------- ?Multi-Disciplinary Care Plan Details ?Patient Name: ?Date of Service: ?Megan Cook, Megan S. 09/12/2021 8:00 A M ?Medical Record Number: 09/14/2021 ?Patient Account Number: [id number] ?Date of Birth/Sex: ?Treating RN: ?01/09/1966 (56 y.o. Female) (51 ?Primary Care Siddharth Babington: Antonieta Iba ?Other Clinician: ?Referring Avital Dancy: ?Treating Shaquela Weichert/Extender: Belva Agee ?Lenda Kelp ?Weeks in Treatment: 3 ?Active Inactive ?Electronic Signature(s) ?Signed: 10/09/2021 2:35:00 PM By: 09/14/2021 ?Previous Signature: 09/12/2021 4:31:55 PM Version By: Antonieta Iba ?Entered By: 09/14/2021 on 10/09/2021 14:35:00 ?-------------------------------------------------------------------------------- ?Pain Assessment Details ?Patient Name: ?Date of Service: ?Megan Cook, Megan S. 09/12/2021 8:00 A M ?Medical Record Number:  [id number] ?Patient Account Number: 05/16/1966 ?Date of Birth/Sex: ?Treating RN: ?[birth year] (56 y.o. Female) Belva Agee ?Primary Care Dela Sweeny: Lenda Kelp ?Other Clinician: ?Referring Oprah Camarena: ?Treating Pro

## 2021-09-13 DIAGNOSIS — I890 Lymphedema, not elsewhere classified: Secondary | ICD-10-CM | POA: Diagnosis not present

## 2021-09-13 DIAGNOSIS — L853 Xerosis cutis: Secondary | ICD-10-CM | POA: Diagnosis not present

## 2021-09-17 NOTE — Progress Notes (Signed)
Megan Cook, Megan Cook ([id number]) ?Visit Report for 08/29/2021 ?Arrival Information Details ?Patient Name: Date of Service: ?Megan Cook, Megan Cook 08/29/2021 2:45 PM ?Medical Record Number: [id number] ?Patient Account Number: [id number] ?Date of Birth/Sex: Treating RN: ?[birth year] (56 y.o. Megan Cook, Megan Cook ?Primary Care Cono Gebhard: Belva Agee Other Clinician: ?Referring Tyrena Gohr: ?Treating Roma Bondar/Extender: Lenda Kelp ?Belva Agee ?Weeks in Treatment: 1 ?Visit Information History Since Last Visit ?Added or deleted any medications: No ?Patient Arrived: Ambulatory ?Any new allergies or adverse reactions: No ?Arrival Time: 14:51 ?Had a fall or experienced change in No ?Accompanied By: dad ?activities of daily living that may affect ?Transfer Assistance: None ?risk of falls: ?Patient Identification Verified: Yes ?Signs or symptoms of abuse/neglect since last visito No ?Secondary Verification Process Completed: Yes ?Hospitalized since last visit: No ?Patient Requires Transmission-Based Precautions: No ?Implantable device outside of the clinic excluding No ?Patient Has Alerts: No ?cellular tissue based products placed in the center ?since last visit: ?Has Dressing in Place as Prescribed: Yes ?Pain Present Now: Yes ?Electronic Signature(s) ?Signed: 09/17/2021 12:30:28 PM By: Fonnie Mu RN ?Entered By: Fonnie Mu on 08/29/2021 14:51:50 ?-------------------------------------------------------------------------------- ?Clinic Level of Care Assessment Details ?Patient Name: Date of Service: ?Megan Cook, Megan Cook 08/29/2021 2:45 PM ?Medical Record Number: [id number] ?Patient Account Number: [id number] ?Date of Birth/Sex: Treating RN: ?Aug 08, 1965 (56 y.o. Megan Cook, Megan Cook ?Primary Care Shantelle Alles: Belva Agee Other Clinician: ?Referring Alexys Gassett: ?Treating Wasif Simonich/Extender: Lenda Kelp ?Belva Agee ?Weeks in Treatment: 1 ?Clinic Level of Care Assessment Items ?TOOL 4 Quantity Score ?X- 1 0 ?Use when only an EandM is  performed on FOLLOW-UP visit ?ASSESSMENTS - Nursing Assessment / Reassessment ?X- 1 10 ?Reassessment of Co-morbidities (includes updates in patient status) ?X- 1 5 ?Reassessment of Adherence to Treatment Plan ?ASSESSMENTS - Wound and Skin A ssessment / Reassessment ?[]  - 0 ?Simple Wound Assessment / Reassessment - one wound ?X- 2 5 ?Complex Wound Assessment / Reassessment - multiple wounds ?[]  - 0 ?Dermatologic / Skin Assessment (not related to wound area) ?ASSESSMENTS - Focused Assessment ?[]  - 0 ?Circumferential Edema Measurements - multi extremities ?[]  - 0 ?Nutritional Assessment / Counseling / Intervention ?[]  - 0 ?Lower Extremity Assessment (monofilament, tuning fork, pulses) ?[]  - 0 ?Peripheral Arterial Disease Assessment (using hand held doppler) ?ASSESSMENTS - Ostomy and/or Continence Assessment and Care ?[]  - 0 ?Incontinence Assessment and Management ?[]  - 0 ?Ostomy Care Assessment and Management (repouching, etc.) ?PROCESS - Coordination of Care ?[]  - 0 ?Simple Patient / Family Education for ongoing care ?X- 1 20 ?Complex (extensive) Patient / Family Education for ongoing care ?X- 1 10 ?Staff obtains Consents, Records, T Results / Process Orders ?est ?[]  - 0 ?Staff telephones HHA, Nursing Homes / Clarify orders / etc ?[]  - 0 ?Routine Transfer to another Facility (non-emergent condition) ?[]  - 0 ?Routine Hospital Admission (non-emergent condition) ?[]  - 0 ?New Admissions / / Ordering NPWT Apligraf, etc. ?, ?[]  - 0 ?Emergency Hospital Admission (emergent condition) ?X- 1 10 ?Simple Discharge Coordination ?[]  - 0 ?Complex (extensive) Discharge Coordination ?PROCESS - Special Needs ?[]  - 0 ?Pediatric / Minor Patient Management ?[]  - 0 ?Isolation Patient Management ?[]  - 0 ?Hearing / Language / Visual special needs ?[]  - 0 ?Assessment of Community assistance (transportation, D/C planning, etc.) ?[]  - 0 ?Additional assistance / Altered mentation ?[]  - 0 ?Support Surface(s) Assessment  (bed, cushion, seat, etc.) ?INTERVENTIONS - Wound Cleansing / Measurement ?[]  - 0 ?Simple Wound Cleansing - one wound ?X- 2 5 ?Complex Wound Cleansing - multiple wounds ?X- 1 5 ?Wound  Imaging (photographs - any number of wounds) ?[]  - 0 ?Wound Tracing (instead of photographs) ?[]  - 0 ?Simple Wound Measurement - one wound ?X- 2 5 ?Complex Wound Measurement - multiple wounds ?INTERVENTIONS - Wound Dressings ?[]  - 0 ?Small Wound Dressing one or multiple wounds ?X- 2 15 ?Medium Wound Dressing one or multiple wounds ?[]  - 0 ?Large Wound Dressing one or multiple wounds ?X- 1 5 ?Application of Medications - topical ?[]  - 0 ?Application of Medications - injection ?INTERVENTIONS - Miscellaneous ?[]  - 0 ?External ear exam ?[]  - 0 ?Specimen Collection (cultures, biopsies, blood, body fluids, etc.) ?[]  - 0 ?Specimen(s) / Culture(s) sent or taken to Lab for analysis ?[]  - 0 ?Patient Transfer (multiple staff / / Similar devices) ?[]  - 0 ?Simple Staple / Suture removal (25 or less) ?[]  - 0 ?Complex Staple / Suture removal (26 or more) ?[]  - 0 ?Hypo / Hyperglycemic Management (close monitor of Blood Glucose) ?[]  - 0 ?Ankle / Brachial Index (ABI) - do not check if billed separately ?X- 1 5 ?Vital Signs ?Has the patient been seen at the hospital within the last three years: Yes ?Total Score: 130 ?Level Of Care: New/Established - Level 4 ?Electronic Signature(s) ?Signed: 09/17/2021 12:30:28 PM By: RN ?Entered By: on 08/29/2021 15:33:03 ?-------------------------------------------------------------------------------- ?Encounter Discharge Information Details ?Patient Name: Date of Service: ?Megan Cook, Megan Cook 08/29/2021 2:45 PM ?Medical Record Number: ?Patient Account Number: ?Date of Birth/Sex: Treating RN: ?[birth year] (56 y.o. , Megan Cook ?Primary Care Sherelle Castelli: Other Clinician: ?Referring Berry Godsey: ?Treating Elye Harmsen/Extender: ?09/19/2021 ?Weeks in Treatment: 1 ?Encounter Discharge Information Items ?Discharge Condition: Stable ?Ambulatory Status: Ambulatory ?Discharge Destination: Home ?Transportation: Private Auto ?Accompanied By: self ?Schedule Follow-up Appointment: Yes ?Clinical Summary of Care: Patient Declined ?Electronic Signature(s) ?Signed: 09/17/2021 12:30:28 PM By: Fonnie Mu RN ?Entered By: 10/29/2021 on 08/29/2021 15:33:54 ?-------------------------------------------------------------------------------- ?Lower Extremity Assessment Details ?Patient Name: Date of Service: ?Megan Cook, Megan Cook 08/29/2021 2:45 PM ?Medical Record Number: [id number] ?Patient Account Number: 05/16/1966 ?Date of Birth/Sex: Treating RN: ?[birth year] (56 y.o. Belva Agee, Megan Cook ?Primary Care Jasmin Trumbull: Lenda Kelp Other Clinician: ?Referring Lynnann Knudsen: ?Treating Trysten Berti/Extender: Belva Agee ?09/19/2021 ?Weeks in Treatment: 1 ?Electronic Signature(s) ?Signed: 09/17/2021 12:30:28 PM By: Fonnie Mu RN ?Entered By: 10/29/2021 on 08/29/2021 15:29:35 ?-------------------------------------------------------------------------------- ?Multi-Disciplinary Care Plan Details ?Patient Name: Date of Service: ?Megan Cook, Megan Cook 08/29/2021 2:45 PM ?Medical Record Number: [id number] ?Patient Account Number: 05/16/1966 ?Date of Birth/Sex: ?Treating RN: ?1966/05/07 (56 y.o. Belva Agee, Megan Cook ?Primary Care Bernhardt Riemenschneider: Lenda Kelp ?Other Clinician: ?Referring Keimani Laufer: ?Treating Nathin Saran/Extender: Belva Agee ?09/19/2021 ?Weeks in Treatment: 1 ?Active Inactive ?Orientation to the Wound Care Program ?Nursing Diagnoses: ?Knowledge deficit related to the wound healing center program ?Goals: ?Patient/caregiver will verbalize understanding of the Wound Healing Center Program ?Date Initiated: 08/22/2021 ?Target Resolution Date: 08/31/2021 ?Goal Status: Active ?Interventions: ?Provide education on orientation to the wound center ?Notes: ?Wound/Skin  Impairment ?Nursing Diagnoses: ?Impaired tissue integrity ?Knowledge deficit related to ulceration/compromised skin integrity ?Goals: ?Patient will have a decrease in wound volume by X% from date: (specify in

## 2021-09-19 ENCOUNTER — Ambulatory Visit: Payer: BC Managed Care – PPO | Admitting: Cardiology

## 2021-09-19 ENCOUNTER — Encounter (HOSPITAL_BASED_OUTPATIENT_CLINIC_OR_DEPARTMENT_OTHER): Payer: BC Managed Care – PPO | Admitting: Physician Assistant

## 2021-09-19 DIAGNOSIS — Z01818 Encounter for other preprocedural examination: Secondary | ICD-10-CM

## 2021-09-19 NOTE — Progress Notes (Signed)
ICD-10-CM   ?1. Pre-op evaluation  Z01.818 EKG 12-Lead  ?  ? ?EKG 09/19/2021: Normal sinus rhythm at rate of 82 bpm, normal axis, incomplete right bundle branch block.  Poor R wave progression, probably normal variant.  Probable normal EKG. ? ? ?Yates Decamp, MD, [facility] ?09/19/2021, 12:57 PM ?Office: [phone] ?Fax: [phone] ?Pager: [phone]  ?

## 2021-09-19 NOTE — Progress Notes (Signed)
EKG 09/19/2021: Normal sinus rhythm at rate of 82 bpm, normal axis, incomplete right bundle branch block.  Poor R wave progression, probably normal variant.  Probable normal EKG.

## 2021-09-26 DIAGNOSIS — G8929 Other chronic pain: Secondary | ICD-10-CM | POA: Diagnosis not present

## 2021-09-26 DIAGNOSIS — R609 Edema, unspecified: Secondary | ICD-10-CM | POA: Diagnosis not present

## 2021-09-26 DIAGNOSIS — T8131XA Disruption of external operation (surgical) wound, not elsewhere classified, initial encounter: Secondary | ICD-10-CM | POA: Diagnosis not present

## 2021-09-26 DIAGNOSIS — E882 Lipomatosis, not elsewhere classified: Secondary | ICD-10-CM | POA: Diagnosis not present

## 2021-09-26 DIAGNOSIS — R262 Difficulty in walking, not elsewhere classified: Secondary | ICD-10-CM | POA: Diagnosis not present

## 2021-10-10 ENCOUNTER — Ambulatory Visit (HOSPITAL_COMMUNITY): Payer: Self-pay

## 2021-10-10 ENCOUNTER — Encounter (HOSPITAL_BASED_OUTPATIENT_CLINIC_OR_DEPARTMENT_OTHER): Payer: BC Managed Care – PPO | Admitting: Physician Assistant

## 2021-10-11 ENCOUNTER — Ambulatory Visit (HOSPITAL_COMMUNITY)
Admission: RE | Admit: 2021-10-11 | Discharge: 2021-10-11 | Disposition: A | Discharge status: Home / Self Care | Payer: BC Managed Care – PPO | Source: Ambulatory Visit | Attending: Internal Medicine | Admitting: Internal Medicine

## 2021-10-11 ENCOUNTER — Other Ambulatory Visit: Payer: Self-pay

## 2021-10-11 ENCOUNTER — Encounter (HOSPITAL_COMMUNITY): Payer: Self-pay

## 2021-10-11 VITALS — BP 158/91 | HR 90 | Temp 98.5°F | Resp 18

## 2021-10-11 DIAGNOSIS — G8918 Other acute postprocedural pain: Secondary | ICD-10-CM

## 2021-10-11 MED ORDER — HYDROCODONE-ACETAMINOPHEN 5-325 MG PO TABS: 1.0000 | ORAL_TABLET | Freq: Two times a day (BID) | ORAL | 0 refills | Status: AC | PRN

## 2021-10-11 NOTE — ED Provider Notes (Signed)
[facility]    CSN: [id number] Arrival date & time: 10/11/21  1647      History   Chief Complaint Chief Complaint  Patient presents with   Leg Pain    Had another Lipedema surgery on my legs. Experiencing swelling and significant nerve pain.  Tylenol and Advil do not help and I can't take Lyrica or Gabapentin.  I will have my husband come with me. - Entered by patient    HPI Megan Cook is a 56 y.o. female with history of lipedema.  Patient with bilateral brachioplasty in 3/23.  Patient had another surgery on backs of legs and buttocks done 09/26/2021.  Surgery done in [place] and patient flew home 10 days later.  Patient currently on Eliquis for DVT prevention until 5/23.  Patient reports pain from recent procedure 8 out of 10 with difficulty sleeping.  Patient very honest with history of narcotic abuse.  She has been sober for several years and follows closely with AA meetings and sponsors.  Patient on Vicodin 5-3 25 and ran out on Monday.  States Advil and Tylenol ineffective at treating pain.  Unable to tolerate Lyrica and gabapentin in the past.  Patient denies any recent fever, chills, chest pain or shortness of breath.   Past Medical History:  Diagnosis Date   Anxiety    Depression    Heart murmur    Hypothyroidism due to Hashimoto's thyroiditis    Lipedema    Lymph edema    Substance abuse (HCC) 03/07/09   Clean since 03/07/2009 and in recovery    Patient Active Problem List   Diagnosis Date Noted   MDD (major depressive disorder), recurrent severe, without psychosis (HCC) 08/08/2019   ADD (attention deficit disorder) 03/11/2018   Swelling of limb 05/18/2015   Hypothyroidism due to Hashimoto's thyroiditis 07/16/2011   DEPRESSION 04/04/2008    Past Surgical History:  Procedure Laterality Date   BRACHIOPLASTY Bilateral 08/02/2021   CESAREAN SECTION     left knee     LIPECTOMY Bilateral 08/02/2021   MEDIAL PARTIAL KNEE REPLACEMENT Left    miniscal  Right    neck fusion     TONSILLECTOMY     TUBAL LIGATION      OB History   No obstetric history on file.      Home Medications    Prior to Admission medications   Medication Sig Start Date End Date Taking? Authorizing Provider  HYDROcodone-acetaminophen (NORCO/VICODIN) 5-325 MG tablet Take 1 tablet by mouth 2 (two) times daily as needed for up to 10 days for moderate pain or severe pain. 10/11/21 10/21/21 Yes Rolla Etienne, NP  buPROPion (WELLBUTRIN XL) 150 MG 24 hr tablet Take 1 tablet (150 mg total) by mouth daily. 03/07/21   Melony Overly T, PA-C  ibuprofen (ADVIL) 800 MG tablet Take 1 tablet (800 mg total) by mouth every 8 (eight) hours as needed (pain). 08/21/21   Zenia Resides, MD  levothyroxine (SYNTHROID) 50 MCG tablet Take 1 tablet (50 mcg total) by mouth daily before breakfast. 10/14/17   Ethelda Chick, MD  pregabalin (LYRICA) 25 MG capsule Take 1-2 capsules (25-50 mg total) by mouth at bedtime. Patient not taking: Reported on 10/11/2021 08/21/21   Zenia Resides, MD  traZODone (DESYREL) 100 MG tablet Take 0.5-1 tablets (50-100 mg total) by mouth at bedtime as needed for sleep. Patient not taking: Reported on 10/11/2021 01/18/21   Gwynneth Macleod    Family History Family History  Problem Relation Age of Onset   Stroke Mother    Thyroid disease Mother    Anxiety disorder Mother    Cancer Father 73       Hairy Cell Leukemia   Colon cancer Father 49   Depression Father    Macular degeneration Father    Thyroid disease Sister    Drug abuse Sister    Thyroid disease Sister    Anxiety disorder Sister    Depression Sister    Cancer Sister        Hairy cell leukemia    Social History Social History   Tobacco Use   Smoking status: Never   Smokeless tobacco: Never  Vaping Use   Vaping Use: Never used  Substance Use Topics   Alcohol use: No    Alcohol/week: 0.0 standard drinks    Comment: NOTE:  Pt broke sobriety on 08/08/19 see notes   Drug use: No      Allergies   Methylisothiazolinone, Tramadol hcl, Carbocaine [mepivacaine hcl], Codeine, Gabapentin, Lyrica [pregabalin], and Trazodone and nefazodone   Review of Systems As stated in HPI otherwise negative   Physical Exam Triage Vital Signs ED Triage Vitals  Enc Vitals Group     BP 10/11/21 1726 (!) 158/91     Pulse Rate 10/11/21 1726 90     Resp 10/11/21 1726 18     Temp 10/11/21 1726 98.5 F (36.9 C)     Temp Source 10/11/21 1726 Oral     SpO2 10/11/21 1726 98 %     Weight --      Height --      Head Circumference --      Peak Flow --      Pain Score 10/11/21 1722 8     Pain Loc --      Pain Edu? --      Excl. in GC? --    No data found.  Updated Vital Signs BP (!) 158/91 (BP Location: Left Arm)   Pulse 90   Temp 98.5 F (36.9 C) (Oral)   Resp 18   LMP 07/13/2011   SpO2 98%   Visual Acuity Right Eye Distance:   Left Eye Distance:   Bilateral Distance:    Right Eye Near:   Left Eye Near:    Bilateral Near:     Physical Exam Constitutional:      General: She is not in acute distress.    Appearance: She is not ill-appearing or toxic-appearing.  Skin:    Comments: Patient with compression garment.  Much removed revealing mild swelling bilateral thigh area.  No bruising, bleeding or drainage noted.  2+ DP pulse  Neurological:     General: No focal deficit present.     Mental Status: She is alert and oriented to person, place, and time.     UC Treatments / Results  Labs (all labs ordered are listed, but only abnormal results are displayed) Labs Reviewed - No data to display  EKG   Radiology No results found.  Procedures Procedures (including critical care time)  Medications Ordered in UC Medications - No data to display  Initial Impression / Assessment and Plan / UC Course  I have reviewed the triage vital signs and the nursing notes.  Pertinent labs & imaging results that were available during my care of the patient were reviewed by  me and considered in my medical decision making (see chart for details).  Surgical pain Patient with recent lipedema surgery done  in [place].  History of same done in 3/23 that also required short-term narcotic use for pain relief.  Pain remains 8/10 despite use of Tylenol and Motrin.  Patient unable to tolerate Lyrica and gabapentin in the past.  Long discussion involving patient and husband regarding her history of substance abuse.  Husband to manage pain medication.  Patient to continue going to AA meetings for continued support.  PDMP reviewed showing last prescription received in 3/23 which is consistent with patient history.  Reviewed expections re: course of current medical issues. Questions answered. Outlined signs and symptoms indicating need for more acute intervention. Pt verbalized understanding. AVS given  Final Clinical Impressions(s) / UC Diagnoses   Final diagnoses:  Pain associated with surgical procedure     Discharge Instructions      I am prescribing you hydrocodone-acetaminophen 5/325mg  as we discussed.  Please use cautiously.  No driving after taking medication.  Please follow-up for any persistent or worsening symptoms.     ED Prescriptions     Medication Sig Dispense Auth. Provider   HYDROcodone-acetaminophen (NORCO/VICODIN) 5-325 MG tablet Take 1 tablet by mouth 2 (two) times daily as needed for up to 10 days for moderate pain or severe pain. 20 tablet Rolla EtienneSmith, Mikeya Tomasetti E, NP      I have reviewed the PDMP during this encounter.   Rolla EtienneSmith, Layali Freund E, NP 10/13/21 [phone]

## 2021-10-11 NOTE — ED Triage Notes (Signed)
Lipedema history.  Recurrent nerve pain.  Patient took the last pain pill Monday post surgery.    Patient reports she is having the same "nerve pain "after this procedure as she did last time.

## 2021-10-11 NOTE — Discharge Instructions (Addendum)
I am prescribing you hydrocodone-acetaminophen 5/325mg  as we discussed.  Please use cautiously.  No driving after taking medication.  Please follow-up for any persistent or worsening symptoms.

## 2021-12-27 DIAGNOSIS — M48061 Spinal stenosis, lumbar region without neurogenic claudication: Secondary | ICD-10-CM | POA: Diagnosis not present

## 2022-01-14 DIAGNOSIS — M545 Low back pain, unspecified: Secondary | ICD-10-CM | POA: Diagnosis not present

## 2022-01-16 DIAGNOSIS — D2271 Melanocytic nevi of right lower limb, including hip: Secondary | ICD-10-CM | POA: Diagnosis not present

## 2022-01-16 DIAGNOSIS — L235 Allergic contact dermatitis due to other chemical products: Secondary | ICD-10-CM | POA: Diagnosis not present

## 2022-01-16 DIAGNOSIS — L905 Scar conditions and fibrosis of skin: Secondary | ICD-10-CM | POA: Diagnosis not present

## 2022-01-16 DIAGNOSIS — L821 Other seborrheic keratosis: Secondary | ICD-10-CM | POA: Diagnosis not present

## 2022-03-05 DIAGNOSIS — M545 Low back pain, unspecified: Secondary | ICD-10-CM | POA: Diagnosis not present

## 2022-04-10 DIAGNOSIS — Z1231 Encounter for screening mammogram for malignant neoplasm of breast: Secondary | ICD-10-CM | POA: Diagnosis not present

## 2022-04-10 DIAGNOSIS — Z6833 Body mass index (BMI) 33.0-33.9, adult: Secondary | ICD-10-CM | POA: Diagnosis not present

## 2022-04-10 DIAGNOSIS — Z01419 Encounter for gynecological examination (general) (routine) without abnormal findings: Secondary | ICD-10-CM | POA: Diagnosis not present

## 2022-04-15 ENCOUNTER — Other Ambulatory Visit: Payer: Self-pay | Admitting: Obstetrics and Gynecology

## 2022-04-15 DIAGNOSIS — Z1321 Encounter for screening for nutritional disorder: Secondary | ICD-10-CM | POA: Diagnosis not present

## 2022-04-15 DIAGNOSIS — E785 Hyperlipidemia, unspecified: Secondary | ICD-10-CM | POA: Diagnosis not present

## 2022-04-15 DIAGNOSIS — Z131 Encounter for screening for diabetes mellitus: Secondary | ICD-10-CM | POA: Diagnosis not present

## 2022-04-15 DIAGNOSIS — Z13228 Encounter for screening for other metabolic disorders: Secondary | ICD-10-CM | POA: Diagnosis not present

## 2022-04-15 DIAGNOSIS — Z803 Family history of malignant neoplasm of breast: Secondary | ICD-10-CM

## 2022-05-13 ENCOUNTER — Ambulatory Visit (HOSPITAL_COMMUNITY)
Admission: RE | Admit: 2022-05-13 | Discharge: 2022-05-13 | Disposition: A | Discharge status: Home / Self Care | Payer: BC Managed Care – PPO | Source: Ambulatory Visit | Attending: Physician Assistant | Admitting: Physician Assistant

## 2022-05-13 ENCOUNTER — Ambulatory Visit (INDEPENDENT_AMBULATORY_CARE_PROVIDER_SITE_OTHER): Payer: BC Managed Care – PPO

## 2022-05-13 ENCOUNTER — Encounter (HOSPITAL_COMMUNITY): Payer: Self-pay

## 2022-05-13 VITALS — BP 162/99 | HR 88 | Temp 98.6°F | Resp 20

## 2022-05-13 DIAGNOSIS — U071 COVID-19: Secondary | ICD-10-CM | POA: Diagnosis not present

## 2022-05-13 DIAGNOSIS — R051 Acute cough: Secondary | ICD-10-CM

## 2022-05-13 DIAGNOSIS — R059 Cough, unspecified: Secondary | ICD-10-CM | POA: Diagnosis not present

## 2022-05-13 DIAGNOSIS — R1110 Vomiting, unspecified: Secondary | ICD-10-CM | POA: Diagnosis not present

## 2022-05-13 MED ORDER — PREDNISONE 50 MG PO TABS: ORAL_TABLET | ORAL | 0 refills | Status: DC

## 2022-05-13 MED ORDER — PROMETHAZINE-DM 6.25-15 MG/5ML PO SYRP: 5.0000 mL | ORAL_SOLUTION | Freq: Four times a day (QID) | ORAL | 0 refills | Status: DC | PRN

## 2022-05-13 NOTE — Discharge Instructions (Addendum)
Follow up with your Physician if symptoms persist

## 2022-05-13 NOTE — ED Triage Notes (Signed)
Pt states that she started with congestion and fever over night Wednesday. She took a at home COVID test on Thursday (05/09/2022) morning was positive. Her husband had a rx of Paxlovid and she started that Saturday AM., since her husband wouldn't be taking it. She is taking sudafed.   She states cough is so bad she is not sleeping and vomiting she would like to see If she has something else going on and possibility have a steroid.

## 2022-05-13 NOTE — ED Provider Notes (Signed)
[facility]    CSN: [id number] Arrival date & time: 05/13/22  1514      History   Chief Complaint Chief Complaint  Patient presents with   Cough    Recovering from Covid but have a horrible cough - Entered by patient   Covid Positive    HPI Megan Cook is a 56 y.o. female.   Pt reports she developed cough and covid symptoms on Thursday.  Pt reports she tested positive at home.  Pt reports her husband had an rx for paxlovid and she began taking.    The history is provided by the patient. No language interpreter was used.  Cough Cough characteristics:  Non-productive Sputum characteristics:  Nondescript Severity:  Moderate Onset quality:  Sudden Duration:  5 days Timing:  Constant Progression:  Worsening Context: sick contacts   Relieved by:  Nothing Worsened by:  Nothing Ineffective treatments:  None tried Risk factors: no recent infection     Past Medical History:  Diagnosis Date   Anxiety    Depression    Heart murmur    Hypothyroidism due to Hashimoto's thyroiditis    Lipedema    Lymph edema    Substance abuse (HCC) 03/07/09   Clean since 03/07/2009 and in recovery    Patient Active Problem List   Diagnosis Date Noted   MDD (major depressive disorder), recurrent severe, without psychosis (HCC) 08/08/2019   ADD (attention deficit disorder) 03/11/2018   Swelling of limb 05/18/2015   Hypothyroidism due to Hashimoto's thyroiditis 07/16/2011   DEPRESSION 04/04/2008    Past Surgical History:  Procedure Laterality Date   BRACHIOPLASTY Bilateral 08/02/2021   CESAREAN SECTION     left knee     LIPECTOMY Bilateral 08/02/2021   MEDIAL PARTIAL KNEE REPLACEMENT Left    miniscal Right    neck fusion     TONSILLECTOMY     TUBAL LIGATION      OB History   No obstetric history on file.      Home Medications    Prior to Admission medications   Medication Sig Start Date End Date Taking? Authorizing Provider  levothyroxine (SYNTHROID)  50 MCG tablet Take 1 tablet (50 mcg total) by mouth daily before breakfast. 10/14/17  Yes Ethelda Chick, MD  predniSONE (DELTASONE) 50 MG tablet One tablet a day for 5 days. 05/13/22  Yes Elson Areas, PA-C  promethazine-dextromethorphan (PROMETHAZINE-DM) 6.25-15 MG/5ML syrup Take 5 mLs by mouth 4 (four) times daily as needed for cough. 05/13/22  Yes Cheron Schaumann K, PA-C  buPROPion (WELLBUTRIN XL) 150 MG 24 hr tablet Take 1 tablet (150 mg total) by mouth daily. 03/07/21   Melony Overly T, PA-C  ibuprofen (ADVIL) 800 MG tablet Take 1 tablet (800 mg total) by mouth every 8 (eight) hours as needed (pain). 08/21/21   Zenia Resides, MD    Family History Family History  Problem Relation Age of Onset   Stroke Mother    Thyroid disease Mother    Anxiety disorder Mother    Cancer Father 14       Hairy Cell Leukemia   Colon cancer Father 50   Depression Father    Macular degeneration Father    Thyroid disease Sister    Drug abuse Sister    Thyroid disease Sister    Anxiety disorder Sister    Depression Sister    Cancer Sister        Hairy cell leukemia    Social History Social  History   Tobacco Use   Smoking status: Never   Smokeless tobacco: Never  Vaping Use   Vaping Use: Never used  Substance Use Topics   Alcohol use: No    Alcohol/week: 0.0 standard drinks of alcohol    Comment: NOTE:  Pt broke sobriety on 08/08/19 see notes   Drug use: No     Allergies   Methylisothiazolinone, Tramadol hcl, Carbocaine [mepivacaine hcl], Codeine, Gabapentin, Lyrica [pregabalin], and Trazodone and nefazodone   Review of Systems Review of Systems  Respiratory:  Positive for cough.   All other systems reviewed and are negative.    Physical Exam Triage Vital Signs ED Triage Vitals  Enc Vitals Group     BP 05/13/22 1538 (!) 162/99     Pulse Rate 05/13/22 1538 88     Resp 05/13/22 1538 20     Temp 05/13/22 1538 98.6 F (37 C)     Temp Source 05/13/22 1538 Oral     SpO2  05/13/22 1538 95 %     Weight --      Height --      Head Circumference --      Peak Flow --      Pain Score 05/13/22 1535 0     Pain Loc --      Pain Edu? --      Excl. in GC? --    No data found.  Updated Vital Signs BP (!) 162/99 (BP Location: Left Arm)   Pulse 88   Temp 98.6 F (37 C) (Oral)   Resp 20   LMP 07/13/2011   SpO2 95%   Visual Acuity Right Eye Distance:   Left Eye Distance:   Bilateral Distance:    Right Eye Near:   Left Eye Near:    Bilateral Near:     Physical Exam Vitals and nursing note reviewed.  Constitutional:      Appearance: She is well-developed.  HENT:     Head: Normocephalic.     Nose: Nose normal.     Mouth/Throat:     Mouth: Mucous membranes are moist.  Cardiovascular:     Rate and Rhythm: Normal rate.  Pulmonary:     Effort: Pulmonary effort is normal.  Abdominal:     General: There is no distension.  Musculoskeletal:        General: Normal range of motion.     Cervical back: Normal range of motion.  Neurological:     Mental Status: She is alert and oriented to person, place, and time.  Psychiatric:        Mood and Affect: Mood normal.      UC Treatments / Results  Labs (all labs ordered are listed, but only abnormal results are displayed) Labs Reviewed - No data to display  EKG   Radiology DG Chest 2 View  Result Date: 05/13/2022 CLINICAL DATA:  Cough interfering with sleep.  Vomiting. EXAM: CHEST - 2 VIEW COMPARISON:  Radiographs 10/08/2017 and 01/24/2013. FINDINGS: The heart size and mediastinal contours are normal. The lungs are clear. There is no pleural effusion or pneumothorax. No acute osseous findings are identified. Stable mild degenerative changes in the spine. Previous lower cervical fusion. IMPRESSION: No active cardiopulmonary disease. Electronically Signed   By: Carey Bullocks M.D.   On: 05/13/2022 16:12    Procedures Procedures (including critical care time)  Medications Ordered in UC Medications  - No data to display  Initial Impression / Assessment and Plan / UC Course  I have reviewed the triage vital signs and the nursing notes.  Pertinent labs & imaging results that were available during my care of the patient were reviewed by me and considered in my medical decision making (see chart for details).     MDM:  Chest xray  shows no acute changes  Pt reports she has responded well to prednisone in the past.   Pt reports no relief with tessalon perles.  Dr. Leonides Grills advised try promethazine DM.  Final Clinical Impressions(s) / UC Diagnoses   Final diagnoses:  Acute cough  COVID   Discharge Instructions   None    ED Prescriptions     Medication Sig Dispense Auth. Provider   promethazine-dextromethorphan (PROMETHAZINE-DM) 6.25-15 MG/5ML syrup Take 5 mLs by mouth 4 (four) times daily as needed for cough. 118 mL Bronislaus Verdell K, PA-C   predniSONE (DELTASONE) 50 MG tablet One tablet a day for 5 days. 5 tablet Elson Areas, [place]      PDMP not reviewed this encounter. An After Visit Summary was printed and given to the patient.    Elson Areas, [place] 05/13/22 1650

## 2022-08-07 ENCOUNTER — Ambulatory Visit: Payer: BC Managed Care – PPO | Admitting: Cardiology

## 2022-08-07 DIAGNOSIS — Z01818 Encounter for other preprocedural examination: Secondary | ICD-10-CM

## 2022-08-07 NOTE — Progress Notes (Signed)
ICD-10-CM   1. Pre-op evaluation  Z01.818 EKG 12-Lead     EKG 08/07/2022: Normal sinus rhythm at the rate of 66 bpm, borderline bradycardia for left atrial enlargement, IRBBB, otherwise normal EKG.

## 2022-09-05 DIAGNOSIS — R609 Edema, unspecified: Secondary | ICD-10-CM | POA: Diagnosis not present

## 2022-09-05 HISTORY — PX: ABDOMINOPLASTY/PANNICULECTOMY: SHX5578

## 2022-09-15 ENCOUNTER — Ambulatory Visit (HOSPITAL_COMMUNITY): Payer: Self-pay

## 2022-09-18 ENCOUNTER — Ambulatory Visit (HOSPITAL_COMMUNITY)
Admission: RE | Admit: 2022-09-18 | Discharge: 2022-09-18 | Disposition: A | Discharge status: Home / Self Care | Payer: BC Managed Care – PPO | Source: Ambulatory Visit | Attending: Emergency Medicine | Admitting: Emergency Medicine

## 2022-09-18 ENCOUNTER — Encounter (HOSPITAL_COMMUNITY): Payer: Self-pay

## 2022-09-18 VITALS — BP 166/92 | HR 88 | Temp 98.2°F | Resp 18

## 2022-09-18 DIAGNOSIS — G8918 Other acute postprocedural pain: Secondary | ICD-10-CM

## 2022-09-18 DIAGNOSIS — Z9889 Other specified postprocedural states: Secondary | ICD-10-CM

## 2022-09-18 DIAGNOSIS — M792 Neuralgia and neuritis, unspecified: Secondary | ICD-10-CM

## 2022-09-18 MED ORDER — PREDNISONE 10 MG (21) PO TBPK: ORAL_TABLET | Freq: Every day | ORAL | 0 refills | Status: AC

## 2022-09-18 MED ORDER — HYDROCODONE-ACETAMINOPHEN 5-325 MG PO TABS: 1.0000 | ORAL_TABLET | Freq: Every evening | ORAL | 0 refills | Status: DC

## 2022-09-18 NOTE — Discharge Instructions (Addendum)
I have sent prednisone taper into your pharmacy. You can start this today! Take 6 pills today (day 1), 5 pills on day 2, then 4 pills on day 3, etc, until course is finished. This may help with nerve pain and is a great anti-inflammatory medicine  I have also sent Vicodin in. Please be cautious taking this medicine. I recommend to take only before bedtime as it will make you drowsy. Do not drink, drive, or make judgement decisions while using this medicine.  Please call the pain clinics below to set up appointment for follow up. They will be a good resource for you as well.

## 2022-09-18 NOTE — ED Provider Notes (Signed)
[facility]    CSN: [id number] Arrival date & time: 09/18/22  4098     History   Chief Complaint Chief Complaint  Patient presents with   Appointment    Nerve pain after surgery in [place] - Entered by patient    HPI Megan Cook is a 57 y.o. female.  Here with post-op pain. Panniculectomy done in [place] on 09/05/2022. History of lipedema. Pain started worsening 3 days ago and is rated 8/10. Feels like her "nerves are on fire". Worse at night, unable to sleep. No fever or chills. Surgical sites are healing well without erythema, swelling, or drainage.  She had surgery on her arms and legs 1 year ago, also in [place] Has been seen here for pain a couple times since then  History of substance abuse that she is very honest about and attends AA meetings. Husband is here who manages her medications. Allergies to tramadol, codeine, gabapentin, pregabalin, trazodone   Past Medical History:  Diagnosis Date   Anxiety    Depression    Heart murmur    Hypothyroidism due to Hashimoto's thyroiditis    Lipedema    Lymph edema    Substance abuse 03/07/09   Clean since 03/07/2009 and in recovery    Patient Active Problem List   Diagnosis Date Noted   MDD (major depressive disorder), recurrent severe, without psychosis 08/08/2019   ADD (attention deficit disorder) 03/11/2018   Swelling of limb 05/18/2015   Hypothyroidism due to Hashimoto's thyroiditis 07/16/2011   DEPRESSION 04/04/2008    Past Surgical History:  Procedure Laterality Date   ABDOMINOPLASTY/PANNICULECTOMY Bilateral 09/05/2022   BRACHIOPLASTY Bilateral 08/02/2021   CESAREAN SECTION     left knee     LIPECTOMY Bilateral 08/02/2021   MEDIAL PARTIAL KNEE REPLACEMENT Left    miniscal Right    neck fusion     TONSILLECTOMY     TUBAL LIGATION      OB History   No obstetric history on file.      Home Medications    Prior to Admission medications   Medication Sig Start Date End Date Taking?  Authorizing Provider  HYDROcodone-acetaminophen (NORCO/VICODIN) 5-325 MG tablet Take 1 tablet by mouth at bedtime for 7 days. 09/18/22 09/25/22 Yes Darbie Biancardi, Lurena Joiner, PA-C  ibuprofen (ADVIL) 800 MG tablet Take 1 tablet (800 mg total) by mouth every 8 (eight) hours as needed (pain). 08/21/21  Yes Zenia Resides, MD  levothyroxine (SYNTHROID) 50 MCG tablet Take 1 tablet (50 mcg total) by mouth daily before breakfast. 10/14/17  Yes Ethelda Chick, MD  predniSONE (STERAPRED UNI-PAK 21 TAB) 10 MG (21) TBPK tablet Take by mouth daily. Please follow dosage instructions on package 09/18/22  Yes Kijana Estock, Lurena Joiner, PA-C    Family History Family History  Problem Relation Age of Onset   Stroke Mother    Thyroid disease Mother    Anxiety disorder Mother    Cancer Father 84       Hairy Cell Leukemia   Colon cancer Father 64   Depression Father    Macular degeneration Father    Thyroid disease Sister    Drug abuse Sister    Thyroid disease Sister    Anxiety disorder Sister    Depression Sister    Cancer Sister        Hairy cell leukemia    Social History Social History   Tobacco Use   Smoking status: Never   Smokeless tobacco: Never  Vaping Use   Vaping  Use: Never used  Substance Use Topics   Alcohol use: No    Alcohol/week: 0.0 standard drinks of alcohol    Comment: NOTE:  Pt broke sobriety on 08/08/19 see notes   Drug use: No     Allergies   Methylisothiazolinone, Tramadol hcl, Carbocaine [mepivacaine hcl], Codeine, Gabapentin, Lyrica [pregabalin], and Trazodone and nefazodone   Review of Systems Review of Systems As per HPI  Physical Exam Triage Vital Signs ED Triage Vitals  Enc Vitals Group     BP      Pulse      Resp      Temp      Temp src      SpO2      Weight      Height      Head Circumference      Peak Flow      Pain Score      Pain Loc      Pain Edu?      Excl. in GC?    No data found.  Updated Vital Signs BP (!) 166/92 (BP Location: Left Arm)   Pulse  88   Temp 98.2 F (36.8 C) (Oral)   Resp 18   LMP 07/13/2011   SpO2 94%    Physical Exam Vitals and nursing note reviewed.  Constitutional:      General: She is not in acute distress.    Appearance: She is not ill-appearing.  HENT:     Mouth/Throat:     Mouth: Mucous membranes are moist.     Pharynx: Oropharynx is clear.  Eyes:     Conjunctiva/sclera: Conjunctivae normal.  Cardiovascular:     Rate and Rhythm: Normal rate and regular rhythm.     Heart sounds: Normal heart sounds.  Pulmonary:     Effort: Pulmonary effort is normal.     Breath sounds: Normal breath sounds.  Abdominal:     General: A surgical scar is present.     Tenderness: There is no abdominal tenderness. There is no right CVA tenderness or left CVA tenderness.     Comments: Healing surgical scars bilateral mid-axillary and mid back  Musculoskeletal:        General: Normal range of motion.     Cervical back: Normal range of motion.  Skin:    Findings: Bruising present. No abscess, erythema or lesion.     Comments: Mild bruising of left back. Surgical sites without drainage, swelling, erythema. Scabbed over and healing well  Neurological:     Mental Status: She is alert and oriented to person, place, and time.  Psychiatric:        Mood and Affect: Mood normal.        Behavior: Behavior normal.     UC Treatments / Results  Labs (all labs ordered are listed, but only abnormal results are displayed) Labs Reviewed - No data to display  EKG  Radiology No results found.  Procedures Procedures  Medications Ordered in UC Medications - No data to display  Initial Impression / Assessment and Plan / UC Course  I have reviewed the triage vital signs and the nursing notes.  Pertinent labs & imaging results that were available during my care of the patient were reviewed by me and considered in my medical decision making (see chart for details).  PDMP reviewed. She self-reports a few Vicodin prescribed  by [facility] that she finished and then pain started this week. Realistically would like to avoid narcotic medication  given her history, urgent care setting not appropriate for controlled substance management of post-op pain. Will send a few tabs Vicodin to use at night. Recommend prednisone course for anti-inflammatory as well. I have provided her with pain clinic information, recommend to call and set up appointment for management, especially with her allergies limiting what I can prescribed from urgent care setting. Return precautions discussed. Patient agrees to plan  Final Clinical Impressions(s) / UC Diagnoses   Final diagnoses:  Post-op pain  S/P panniculectomy  Nerve pain     Discharge Instructions      I have sent prednisone taper into your pharmacy. You can start this today! Take 6 pills today (day 1), 5 pills on day 2, then 4 pills on day 3, etc, until course is finished. This may help with nerve pain and is a great anti-inflammatory medicine  I have also sent Vicodin in. Please be cautious taking this medicine. I recommend to take only before bedtime as it will make you drowsy. Do not drink, drive, or make judgement decisions while using this medicine.  Please call the pain clinics below to set up appointment for follow up. They will be a good resource for you as well.     ED Prescriptions     Medication Sig Dispense Auth. Provider   predniSONE (STERAPRED UNI-PAK 21 TAB) 10 MG (21) TBPK tablet Take by mouth daily. Please follow dosage instructions on package 21 tablet Taygen Newsome, Lurena Joiner, PA-C   HYDROcodone-acetaminophen (NORCO/VICODIN) 5-325 MG tablet Take 1 tablet by mouth at bedtime for 7 days. 7 tablet Kevina Piloto, Lurena Joiner, PA-C      I have reviewed the PDMP during this encounter.   Areya Lemmerman, Lurena Joiner, [place] 09/18/22 1048

## 2022-09-18 NOTE — ED Triage Notes (Addendum)
Patient had torso skin removal surgery (Panniculectomy) in [place]. Surgery was 09/05/22. Patient went back to work Monday. Patient having swelling and nerve pain, feels like on fire.  Taking tylenol and advil with no relief.

## 2022-09-22 ENCOUNTER — Ambulatory Visit (HOSPITAL_COMMUNITY)
Admission: RE | Admit: 2022-09-22 | Discharge: 2022-09-22 | Disposition: A | Discharge status: Home / Self Care | Payer: BC Managed Care – PPO | Source: Ambulatory Visit | Attending: Obstetrics and Gynecology | Admitting: Obstetrics and Gynecology

## 2022-09-22 ENCOUNTER — Other Ambulatory Visit: Payer: Self-pay

## 2022-09-22 ENCOUNTER — Emergency Department (HOSPITAL_BASED_OUTPATIENT_CLINIC_OR_DEPARTMENT_OTHER)
Admission: EM | Admit: 2022-09-22 | Discharge: 2022-09-22 | Disposition: A | Discharge status: Home / Self Care | Payer: BC Managed Care – PPO | Attending: Emergency Medicine | Admitting: Emergency Medicine

## 2022-09-22 ENCOUNTER — Encounter (HOSPITAL_COMMUNITY): Payer: Self-pay

## 2022-09-22 ENCOUNTER — Encounter (HOSPITAL_BASED_OUTPATIENT_CLINIC_OR_DEPARTMENT_OTHER): Payer: Self-pay | Admitting: Emergency Medicine

## 2022-09-22 ENCOUNTER — Emergency Department (HOSPITAL_BASED_OUTPATIENT_CLINIC_OR_DEPARTMENT_OTHER): Payer: BC Managed Care – PPO

## 2022-09-22 VITALS — BP 177/93 | HR 96 | Temp 99.2°F | Resp 18

## 2022-09-22 DIAGNOSIS — R109 Unspecified abdominal pain: Secondary | ICD-10-CM | POA: Insufficient documentation

## 2022-09-22 DIAGNOSIS — Z9889 Other specified postprocedural states: Secondary | ICD-10-CM

## 2022-09-22 DIAGNOSIS — G8918 Other acute postprocedural pain: Secondary | ICD-10-CM

## 2022-09-22 LAB — LIPASE, BLOOD: Lipase: 25 U/L (ref 11–51)

## 2022-09-22 LAB — COMPREHENSIVE METABOLIC PANEL
ALT: 32 U/L (ref 0–44)
AST: 20 U/L (ref 15–41)
Albumin: 4.6 g/dL (ref 3.5–5.0)
Alkaline Phosphatase: 102 U/L (ref 38–126)
Anion gap: 13 (ref 5–15)
BUN: 18 mg/dL (ref 6–20)
CO2: 20 mmol/L — ABNORMAL LOW (ref 22–32)
Calcium: 9.7 mg/dL (ref 8.9–10.3)
Chloride: 107 mmol/L (ref 98–111)
Creatinine, Ser: 0.8 mg/dL (ref 0.44–1.00)
GFR, Estimated: >60 mL/min (ref >60)
Glucose, Bld: 115 mg/dL — ABNORMAL HIGH (ref 70–99)
Potassium: 4.5 mmol/L (ref 3.5–5.1)
Sodium: 140 mmol/L (ref 135–145)
Total Bilirubin: 0.3 mg/dL (ref 0.3–1.2)
Total Protein: 7.2 g/dL (ref 6.5–8.1)

## 2022-09-22 LAB — CBC WITH DIFFERENTIAL/PLATELET
Abs Immature Granulocytes: 0.05 10*3/uL (ref 0.00–0.07)
Basophils Absolute: 0 10*3/uL (ref 0.0–0.1)
Basophils Relative: 0 %
Eosinophils Absolute: 0 10*3/uL (ref 0.0–0.5)
Eosinophils Relative: 0 %
HCT: 35.8 % — ABNORMAL LOW (ref 36.0–46.0)
Hemoglobin: 12 g/dL (ref 12.0–15.0)
Immature Granulocytes: 1 %
Lymphocytes Relative: 16 %
Lymphs Abs: 1 10*3/uL (ref 0.7–4.0)
MCH: 28.1 pg (ref 26.0–34.0)
MCHC: 33.5 g/dL (ref 30.0–36.0)
MCV: 83.8 fL (ref 80.0–100.0)
Monocytes Absolute: 0.2 10*3/uL (ref 0.1–1.0)
Monocytes Relative: 3 %
Neutro Abs: 4.9 10*3/uL (ref 1.7–7.7)
Neutrophils Relative %: 80 %
Platelets: 652 10*3/uL — ABNORMAL HIGH (ref 150–400)
RBC: 4.27 MIL/uL (ref 3.87–5.11)
RDW: 13.6 % (ref 11.5–15.5)
WBC: 6.1 10*3/uL (ref 4.0–10.5)
nRBC: 0 % (ref 0.0–0.2)

## 2022-09-22 MED ORDER — HYDROCODONE-ACETAMINOPHEN 5-325 MG PO TABS: 1.0000 | ORAL_TABLET | Freq: Four times a day (QID) | ORAL | 0 refills | Status: DC | PRN

## 2022-09-22 MED ORDER — HYDROCODONE-ACETAMINOPHEN 5-325 MG PO TABS
1.0000 | ORAL_TABLET | Freq: Once | ORAL | Status: AC
  Administered 2022-09-22: 1 via ORAL
  Filled 2022-09-22: qty 1

## 2022-09-22 NOTE — ED Provider Notes (Signed)
[facility]    CSN: [id number] Arrival date & time: 09/22/22  1608      History   Chief Complaint Chief Complaint  Patient presents with   Post-op Problem   Abdominal Pain    Full torso pain    HPI Megan Cook is a 57 y.o. female.   Patient with history of lipedema presents to urgent care for evaluation of severe abdominal pain status post paniculectomy performed in [place] on 09/05/2022 (17 days ago). States her abdomen feels like it is "on fire" and pain is a burning sensation. No redness, swelling, warmth, or drainage to surgical sites. States she flew back to [place] and returned to work about 1 week ago. She is here requesting assistance with post-op pain management and states this is where she has received help with this in the past after her last 3 surgeries. Pain is 10/10 currently. No recent fever/chills, N/V/D, or urinary symptoms. She has been taking Augmentin antibiotic as prescribed by her surgeon. Ibuprofen and tylenol are not helping with pain.  She is very open about her past history of substance abuse and has been clean for over 10 years. She was prescribed Vicodin tablets by her surgeon in [place] (self reported) but has taken all of these. States pain started when she got back home and returned to work at an [employer]. She was evaluated at urgent care 4 days ago where she was prescribed predisone dosepak for inflammation and she was given 7 tablets of vicodin to be taken once daily at bedtime. She has finished all 7 tablets in the last 4 days and states she takes one pill after work, then when she goes to bed. Last dose of vicodin was last night. She is unable to get in with pain management clinic recommended at visit 4 days ago as she needs a referral for this.    Abdominal Pain   Past Medical History:  Diagnosis Date   Anxiety    Depression    Heart murmur    Hypothyroidism due to Hashimoto's thyroiditis    Lipedema    Lymph  edema    Substance abuse (HCC) 03/07/09   Clean since 03/07/2009 and in recovery    Patient Active Problem List   Diagnosis Date Noted   MDD (major depressive disorder), recurrent severe, without psychosis (HCC) 08/08/2019   ADD (attention deficit disorder) 03/11/2018   Swelling of limb 05/18/2015   Hypothyroidism due to Hashimoto's thyroiditis 07/16/2011   DEPRESSION 04/04/2008    Past Surgical History:  Procedure Laterality Date   ABDOMINOPLASTY/PANNICULECTOMY Bilateral 09/05/2022   BRACHIOPLASTY Bilateral 08/02/2021   CESAREAN SECTION     left knee     LIPECTOMY Bilateral 08/02/2021   MEDIAL PARTIAL KNEE REPLACEMENT Left    miniscal Right    neck fusion     TONSILLECTOMY     TUBAL LIGATION      OB History   No obstetric history on file.      Home Medications    Prior to Admission medications   Medication Sig Start Date End Date Taking? Authorizing Provider  HYDROcodone-acetaminophen (NORCO/VICODIN) 5-325 MG tablet Take 1 tablet by mouth at bedtime for 7 days. 09/18/22 09/25/22 Yes Rising, Lurena Joiner, PA-C  ibuprofen (ADVIL) 800 MG tablet Take 1 tablet (800 mg total) by mouth every 8 (eight) hours as needed (pain). 08/21/21  Yes Zenia Resides, MD  levothyroxine (SYNTHROID) 50 MCG tablet Take 1 tablet (50 mcg total) by mouth daily  before breakfast. 10/14/17  Yes Ethelda Chick, MD  predniSONE (STERAPRED UNI-PAK 21 TAB) 10 MG (21) TBPK tablet Take by mouth daily. Please follow dosage instructions on package 09/18/22  Yes Rising, Lurena Joiner, PA-C    Family History Family History  Problem Relation Age of Onset   Stroke Mother    Thyroid disease Mother    Anxiety disorder Mother    Cancer Father 86       Hairy Cell Leukemia   Colon cancer Father 42   Depression Father    Macular degeneration Father    Thyroid disease Sister    Drug abuse Sister    Thyroid disease Sister    Anxiety disorder Sister    Depression Sister    Cancer Sister        Hairy cell leukemia     Social History Social History   Tobacco Use   Smoking status: Never   Smokeless tobacco: Never  Vaping Use   Vaping Use: Never used  Substance Use Topics   Alcohol use: No    Alcohol/week: 0.0 standard drinks of alcohol    Comment: NOTE:  Pt broke sobriety on 08/08/19 see notes   Drug use: No     Allergies   Methylisothiazolinone, Tramadol hcl, Carbocaine [mepivacaine hcl], Codeine, Gabapentin, Lyrica [pregabalin], and Trazodone and nefazodone   Review of Systems Review of Systems  Gastrointestinal:  Positive for abdominal pain.  Per HPI   Physical Exam Triage Vital Signs ED Triage Vitals [09/22/22 1642]  Enc Vitals Group     BP (!) 177/93     Pulse Rate 96     Resp 18     Temp 99.2 F (37.3 C)     Temp Source Oral     SpO2 96 %     Weight      Height      Head Circumference      Peak Flow      Pain Score 9     Pain Loc      Pain Edu?      Excl. in GC?    No data found.  Updated Vital Signs BP (!) 177/93 (BP Location: Left Arm)   Pulse 96   Temp 99.2 F (37.3 C) (Oral)   Resp 18   LMP 07/13/2011   SpO2 96%   Visual Acuity Right Eye Distance:   Left Eye Distance:   Bilateral Distance:    Right Eye Near:   Left Eye Near:    Bilateral Near:     Physical Exam Vitals and nursing note reviewed.  Constitutional:      Appearance: She is not ill-appearing or toxic-appearing.  HENT:     Head: Normocephalic and atraumatic.     Right Ear: Hearing and external ear normal.     Left Ear: Hearing and external ear normal.     Nose: Nose normal.     Mouth/Throat:     Lips: Pink.  Eyes:     General: Lids are normal. Vision grossly intact. Gaze aligned appropriately.     Extraocular Movements: Extraocular movements intact.     Conjunctiva/sclera: Conjunctivae normal.  Pulmonary:     Effort: Pulmonary effort is normal.  Musculoskeletal:     Cervical back: Neck supple.  Skin:    General: Skin is warm and dry.     Capillary Refill: Capillary refill  takes less than 2 seconds.     Findings: No rash.     Comments: No erythema, drainage,  warmth, swelling, or tenderness to surgical sites to the abdomen, back, and axillas.  Neurological:     General: No focal deficit present.     Mental Status: She is alert and oriented to person, place, and time. Mental status is at baseline.     Cranial Nerves: No dysarthria or facial asymmetry.  Psychiatric:        Attention and Perception: Attention normal.        Mood and Affect: Mood normal. Affect is tearful.        Speech: Speech normal.        Behavior: Behavior normal.        Thought Content: Thought content normal.        Judgment: Judgment normal.      UC Treatments / Results  Labs (all labs ordered are listed, but only abnormal results are displayed) Labs Reviewed - No data to display  EKG   Radiology No results found.  Procedures Procedures (including critical care time)  Medications Ordered in UC Medications - No data to display  Initial Impression / Assessment and Plan / UC Course  I have reviewed the triage vital signs and the nursing notes.  Pertinent labs & imaging results that were available during my care of the patient were reviewed by me and considered in my medical decision making (see chart for details).   1. Post-operative pain, status post panniculectomy PDMP reviewed. Surgery performed was 17 days ago. Patient, husband, and I discussed appropriate pain management at length as well as concern for worsening symptoms/pain at the 17 day post-op point. Pain should be getting better and instead are not responding well to outpatient treatment with both prednisone and Norco/vicodin. Patient did not take Vicodin as prescribed and I am uncomfortable prescribing more for her in the urgent care setting. I am concerned for potential intraabdominal post-operative problem and believe patient would benefit from advanced imaging and ER visit for further management of her worsening  post-op pain as she is on day 17 and has not improved. Patient and husband express disagreement with recommendations and request enough Vicodin to get her through "10 days to get her over the hump of pain" that has happened with previous surgeries.  I reviewed case with Dr. Loreta Ave who is in agreement that patient needs to be seen in the ER and it would not be safe or in her best interest if we continue to manage her pain from the urgent care setting at this time. Reviewed recommendations with patient once more and discussed risks of deferring ED visit, patient discharged from urgent care to the emergency department.   Final Clinical Impressions(s) / UC Diagnoses   Final diagnoses:  Post-operative pain  Status post panniculectomy     Discharge Instructions      I am concerned that your pain is not improving with the interventions provided to urgent care 4 days ago.  I would like for you to go to the nearest emergency room to get a further workup and evaluation to make sure that you do not have an internal abdominal infection related to your surgery.    Please proceed to [facility] emergency department immediately for further workup and evaluation.   ED Prescriptions   None    PDMP not reviewed this encounter.   Carlisle Beers, [place] 09/29/22 2139

## 2022-09-22 NOTE — ED Provider Notes (Signed)
[place] EMERGENCY DEPARTMENT AT [facility] Provider Note   CSN: [id number] Arrival date & time: 09/22/22  1756     History  Chief Complaint  Patient presents with   Post-op Problem    Megan Cook is a 57 y.o. female.  Patient here with abdominal pain.  Just had surgery a few weeks in [place].  She is having pain from the surgery.  She denies any nausea vomiting diarrhea.  Has a history of lipedema which is why she had surgery.  She has had abdominoplasty and panniculectomy couple weeks ago.  Denies any chest pain, shortness of breath, weakness numbness or headaches.  She is having typical postop pain.  She has had this surgery for separate times.  She ends up getting some nerve related pain afterwards for couple weeks.  She has the surgery in [place] because only a few people this procedure.  She does have a primary care doctor here to help manage her pain postop.  She denies any fevers or chills.  She does not want any advanced imaging.  The history is provided by the patient.       Home Medications Prior to Admission medications   Medication Sig Start Date End Date Taking? Authorizing Provider  HYDROcodone-acetaminophen (NORCO/VICODIN) 5-325 MG tablet Take 1 tablet by mouth every 6 (six) hours as needed for up to 15 doses for severe pain. 09/22/22  Yes Chloey Ricard, DO  ibuprofen (ADVIL) 800 MG tablet Take 1 tablet (800 mg total) by mouth every 8 (eight) hours as needed (pain). 08/21/21   Zenia Resides, MD  levothyroxine (SYNTHROID) 50 MCG tablet Take 1 tablet (50 mcg total) by mouth daily before breakfast. 10/14/17   Ethelda Chick, MD  predniSONE (STERAPRED UNI-PAK 21 TAB) 10 MG (21) TBPK tablet Take by mouth daily. Please follow dosage instructions on package 09/18/22   Rising, Lurena Joiner, PA-C      Allergies    Methylisothiazolinone, Tramadol hcl, Carbocaine [mepivacaine hcl], Codeine, Gabapentin, Lyrica [pregabalin], and Trazodone and nefazodone     Review of Systems   Review of Systems  Physical Exam Updated Vital Signs BP (!) 165/97   Pulse 89   Temp 99.4 F (37.4 C) (Oral)   Resp 18   Ht 5\' 7"  (1.702 m)   Wt 90.7 kg   LMP 07/13/2011   SpO2 97%   BMI 31.32 kg/m  Physical Exam Vitals and nursing note reviewed.  Constitutional:      General: She is not in acute distress.    Appearance: She is well-developed.  HENT:     Head: Normocephalic and atraumatic.  Eyes:     Extraocular Movements: Extraocular movements intact.     Conjunctiva/sclera: Conjunctivae normal.     Pupils: Pupils are equal, round, and reactive to light.  Cardiovascular:     Rate and Rhythm: Normal rate and regular rhythm.     Pulses: Normal pulses.     Heart sounds: No murmur heard. Pulmonary:     Effort: Pulmonary effort is normal. No respiratory distress.     Breath sounds: Normal breath sounds.  Abdominal:     Palpations: Abdomen is soft.     Tenderness: There is abdominal tenderness.  Musculoskeletal:        General: No swelling.     Cervical back: Normal range of motion and neck supple.  Skin:    General: Skin is warm and dry.     Capillary Refill: Capillary refill takes less than 2 seconds.  Comments: Surgical sites are clean dry and intact  Neurological:     Mental Status: She is alert.  Psychiatric:        Mood and Affect: Mood normal.     ED Results / Procedures / Treatments   Labs (all labs ordered are listed, but only abnormal results are displayed) Labs Reviewed  CBC WITH DIFFERENTIAL/PLATELET - Abnormal; Notable for the following components:      Result Value   HCT 35.8 (*)    Platelets 652 (*)    All other components within normal limits  COMPREHENSIVE METABOLIC PANEL - Abnormal; Notable for the following components:   CO2 20 (*)    Glucose, Bld 115 (*)    All other components within normal limits  LIPASE, BLOOD    EKG None  Radiology No results found.  Procedures Procedures    Medications Ordered  in ED Medications  HYDROcodone-acetaminophen (NORCO/VICODIN) 5-325 MG per tablet 1 tablet (has no administration in time range)    ED Course/ Medical Decision Making/ A&P                             Medical Decision Making Amount and/or Complexity of Data Reviewed Labs: ordered. Radiology: ordered.  Risk Prescription drug management.   [name] is here with postop pain.  She has a history of the lipedema and just had surgery a couple weeks ago to remove a few in [place] where her Careers adviser as these.  She had the surgery couple times.  Not many people do the surgery supposedly.  She does have a primary care doctor here.  She has been back to work.  But she is having some nerve related pain and discomfort in her surgical sites that she has had in the past.  Gabapentin and Lyrica have not helped in the past and ultimately hydrocodone has helped.  She does not typically need it for long but since she is on her feet all day she feels like things are getting a little bit worse.  Overall her surgical sites are clean dry and intact.  Her lab work is normal.  She has no fever or leukocytosis.  Electrolytes are remarkable.  She has no abdominal tenderness.  Will write her for hydrocodone.  She is trying to find a primary care doctor.  Discharged in good condition.  Understands return precautions.  This chart was dictated using voice recognition software.  Despite best efforts to proofread,  errors can occur which can change the documentation meaning.         Final Clinical Impression(s) / ED Diagnoses Final diagnoses:  Post-op pain    Rx / DC Orders ED Discharge Orders          Ordered    HYDROcodone-acetaminophen (NORCO/VICODIN) 5-325 MG tablet  Every 6 hours PRN        09/22/22 1939              Virgina Norfolk, DO 09/22/22 1942

## 2022-09-22 NOTE — ED Notes (Signed)
Discharge paperwork given and verbally understood. 

## 2022-09-22 NOTE — ED Triage Notes (Addendum)
Patient presenting with ongoing pain for abdominal surgery. Prednisone from Wednesday visit not helping, Patient out of her pain medication from surgeons in [place].   Pain clinic would not accept referral from urgent care and also informed the Patient that they do not handle short term pain from surgeries.   Pain is not worse but not getting better. Patient had been taking the hydrocodone twice daily, after work and before bed. Tooke her last dose of the hydrocodone yesterday afternoon. States feels like her entire torso is on fire.

## 2022-09-22 NOTE — ED Notes (Signed)
Patient is being discharged from the Urgent Care and sent to the Emergency Department via POV . Per Juliet Rude NP, patient is in need of higher level of care due to possible infection post op. Patient is aware and verbalizes understanding of plan of care.  Vitals:   09/22/22 1642  BP: (!) 177/93  Pulse: 96  Resp: 18  Temp: 99.2 F (37.3 C)  SpO2: 96%

## 2022-09-22 NOTE — Discharge Instructions (Signed)
I am concerned that your pain is not improving with the interventions provided to urgent care 4 days ago.  I would like for you to go to the nearest emergency room to get a further workup and evaluation to make sure that you do not have an internal abdominal infection related to your surgery.    Please proceed to [facility] emergency department immediately for further workup and evaluation.

## 2022-09-22 NOTE — ED Triage Notes (Signed)
Pt via pov from UC with pain from surgery. She had lipedema and has periodic surgeries (this is #4) for the same. She had the surgery in [place] on 4/11. Pt states she went to UC due to pain continuing and was told they could not help her. She has no pcp to see for help. Pt alert & oriented, nad noted.

## 2022-10-02 DIAGNOSIS — H52203 Unspecified astigmatism, bilateral: Secondary | ICD-10-CM | POA: Diagnosis not present

## 2022-10-02 DIAGNOSIS — H40053 Ocular hypertension, bilateral: Secondary | ICD-10-CM | POA: Diagnosis not present

## 2022-10-03 ENCOUNTER — Emergency Department (HOSPITAL_BASED_OUTPATIENT_CLINIC_OR_DEPARTMENT_OTHER)
Admission: EM | Admit: 2022-10-03 | Discharge: 2022-10-03 | Disposition: A | Discharge status: Home / Self Care | Payer: BC Managed Care – PPO | Attending: Emergency Medicine | Admitting: Emergency Medicine

## 2022-10-03 ENCOUNTER — Other Ambulatory Visit: Payer: Self-pay

## 2022-10-03 ENCOUNTER — Encounter (HOSPITAL_BASED_OUTPATIENT_CLINIC_OR_DEPARTMENT_OTHER): Payer: Self-pay | Admitting: Emergency Medicine

## 2022-10-03 DIAGNOSIS — G8918 Other acute postprocedural pain: Secondary | ICD-10-CM | POA: Insufficient documentation

## 2022-10-03 MED ORDER — OXYCODONE-ACETAMINOPHEN 5-325 MG PO TABS: 1.0000 | ORAL_TABLET | Freq: Three times a day (TID) | ORAL | 0 refills | Status: DC | PRN

## 2022-10-03 MED ORDER — OXYCODONE-ACETAMINOPHEN 5-325 MG PO TABS
1.0000 | ORAL_TABLET | Freq: Once | ORAL | Status: AC
  Administered 2022-10-03: 1 via ORAL
  Filled 2022-10-03: qty 1

## 2022-10-03 NOTE — Discharge Instructions (Addendum)
Please make sure to follow-up with a primary care provider for further refills.  This medication does contain Tylenol do not take any additional Tylenol containing products while taking this medication

## 2022-10-03 NOTE — ED Provider Notes (Signed)
[place] EMERGENCY DEPARTMENT AT [facility] Provider Note   CSN: [id number] Arrival date & time: 10/03/22  1723     History  Chief Complaint  Patient presents with   Post-op Problem    Megan Cook is a 57 y.o. female here for evaluation of pain. States had recent surgery in [place] for lipedema. Has continued "nerve pain" to her sites. Cannot take Gabapentin or Lyrica. Called surgeron who stated pain can be worse when working in the flanks. Denies fever, N/V. Cp, sob, abd pain. States pain only with touching skin "like a burning pain." Has apt to establish care with PCP end of month./ Has been seen previously for same issue. States this is her typical pain with this type of surgery. Has had same procedure to multiple other areas x7. Np drainage, redness to surgical sites. Wear compression garments  Does not want labs or imaging  HPI     Home Medications Prior to Admission medications   Medication Sig Start Date End Date Taking? Authorizing Provider  oxyCODONE-acetaminophen (PERCOCET/ROXICET) 5-325 MG tablet Take 1 tablet by mouth every 8 (eight) hours as needed for severe pain. 10/03/22  Yes Adam Sanjuan A, PA-C  HYDROcodone-acetaminophen (NORCO/VICODIN) 5-325 MG tablet Take 1 tablet by mouth every 6 (six) hours as needed for up to 15 doses for severe pain. 09/22/22   Curatolo, Adam, DO  ibuprofen (ADVIL) 800 MG tablet Take 1 tablet (800 mg total) by mouth every 8 (eight) hours as needed (pain). 08/21/21   Zenia Resides, MD  levothyroxine (SYNTHROID) 50 MCG tablet Take 1 tablet (50 mcg total) by mouth daily before breakfast. 10/14/17   Ethelda Chick, MD  predniSONE (STERAPRED UNI-PAK 21 TAB) 10 MG (21) TBPK tablet Take by mouth daily. Please follow dosage instructions on package 09/18/22   Rising, Lurena Joiner, PA-C      Allergies    Methylisothiazolinone, Tramadol hcl, Carbocaine [mepivacaine hcl], Codeine, Gabapentin, Lyrica [pregabalin], and Trazodone and  nefazodone    Review of Systems   Review of Systems  Constitutional: Negative.   HENT: Negative.    Respiratory: Negative.    Cardiovascular: Negative.   Gastrointestinal: Negative.   Genitourinary: Negative.   Musculoskeletal: Negative.   Skin:  Positive for wound.  Neurological: Negative.   All other systems reviewed and are negative.   Physical Exam Updated Vital Signs BP (!) 167/106 (BP Location: Right Arm)   Pulse 93   Temp 99.1 F (37.3 C) (Oral)   Resp 17   Ht 5\' 7"  (1.702 m)   Wt 90.7 kg   LMP 07/13/2011   SpO2 100%   BMI 31.32 kg/m  Physical Exam Vitals and nursing note reviewed.  Constitutional:      General: She is not in acute distress.    Appearance: She is well-developed. She is not ill-appearing, toxic-appearing or diaphoretic.  HENT:     Head: Atraumatic.     Nose: Nose normal.     Mouth/Throat:     Mouth: Mucous membranes are moist.  Eyes:     Pupils: Pupils are equal, round, and reactive to light.  Cardiovascular:     Rate and Rhythm: Normal rate.     Pulses: Normal pulses.     Heart sounds: Normal heart sounds.  Pulmonary:     Effort: Pulmonary effort is normal. No respiratory distress.     Breath sounds: Normal breath sounds.  Abdominal:     General: Bowel sounds are normal. There is no distension.  Palpations: Abdomen is soft.  Musculoskeletal:        General: Normal range of motion.     Cervical back: Normal range of motion.  Skin:    General: Skin is warm and dry.     Capillary Refill: Capillary refill takes less than 2 seconds.     Comments: Surgical wounds bilateral flanks. No surrounding erythema, warmth, drainage  Neurological:     General: No focal deficit present.     Mental Status: She is alert.  Psychiatric:        Mood and Affect: Mood normal.    ED Results / Procedures / Treatments   Labs (all labs ordered are listed, but only abnormal results are displayed) Labs Reviewed - No data to  display  EKG None  Radiology No results found.  Procedures Procedures    Medications Ordered in ED Medications  oxyCODONE-acetaminophen (PERCOCET/ROXICET) 5-325 MG per tablet 1 tablet (1 tablet Oral Given 10/03/22 2042)    ED Course/ Medical Decision Making/ A&P    57 year old here for evaluation of postop pain.  Has lipedema, has had multiple surgical procedures to remove these areas in [place].  She typically gets postop "nerve pain" however she cannot take gabapentin or Lyrica.  Her pain has been more prolonged with her most recent surgery.  She has been seen emergency department twice for similar symptoms.  States she called her surgeon who stated she needed to follow-up locally.  She denies any drainage, redness, warmth.  She denies any chest pain or abdominal pain.  Just states "my skin hurts when you touch it."  She has no overlying skin changes to suggest cellulitis, abscess or shingles.  I discussed obtaining labs and imaging which patient declined.  She voiced understand risk versus benefit to include missed infection, surgical emergency, death.  She just needs medication as she is dealt with this previously and this feels like her typical postop.  For short course I encouraged patient to follow-up with PCP for remaining prescription medication.  Her wounds overall do not appear grossly infected.  Return if she changes her mind about further labs and imaging at this time.  The patient has been appropriately medically screened and/or stabilized in the ED. I have low suspicion for any other emergent medical condition which would require further screening, evaluation or treatment in the ED or require inpatient management.  Patient is hemodynamically stable and in no acute distress.  Patient able to ambulate in department prior to ED.  Evaluation does not show acute pathology that would require ongoing or additional emergent interventions while in the emergency department or  further inpatient treatment.  I have discussed the diagnosis with the patient and answered all questions.  Pain is been managed while in the emergency department and patient has no further complaints prior to discharge.  Patient is comfortable with plan discussed in room and is stable for discharge at this time.  I have discussed strict return precautions for returning to the emergency department.  Patient was encouraged to follow-up with PCP/specialist refer to at discharge.                             Medical Decision Making Amount and/or Complexity of Data Reviewed Independent Historian: spouse External Data Reviewed: labs, radiology and notes.  Risk OTC drugs. Prescription drug management. Decision regarding hospitalization. Diagnosis or treatment significantly limited by social determinants of health.  Final Clinical Impression(s) / ED Diagnoses Final diagnoses:  Post-op pain    Rx / DC Orders ED Discharge Orders          Ordered    oxyCODONE-acetaminophen (PERCOCET/ROXICET) 5-325 MG tablet  Every 8 hours PRN        10/03/22 2040              Makhya Arave A, PA-C 10/03/22 2141    Tegeler, Canary Brim, MD 10/04/22 0003

## 2022-10-03 NOTE — ED Triage Notes (Signed)
Pt arrives to ED with c/o continued post-op nerve pain post lipedema in [place] many weeks ago. Was seen in ED on 4/28 for same issue, as PCP visit upcoming. Pt reports she is unable to take her nerve pain post surgery in her back and bilateral sides. Was prescribed Vicodin which helped but she has ran out and needs some pain control especially to help her sleep.

## 2022-10-15 DIAGNOSIS — R609 Edema, unspecified: Secondary | ICD-10-CM | POA: Diagnosis not present

## 2022-10-15 DIAGNOSIS — Z0000 Encounter for general adult medical examination without abnormal findings: Secondary | ICD-10-CM | POA: Diagnosis not present

## 2022-10-15 DIAGNOSIS — G8918 Other acute postprocedural pain: Secondary | ICD-10-CM | POA: Diagnosis not present

## 2022-11-04 ENCOUNTER — Emergency Department (HOSPITAL_BASED_OUTPATIENT_CLINIC_OR_DEPARTMENT_OTHER)
Admission: EM | Admit: 2022-11-04 | Discharge: 2022-11-04 | Disposition: A | Discharge status: Home / Self Care | Payer: BC Managed Care – PPO | Attending: Emergency Medicine | Admitting: Emergency Medicine

## 2022-11-04 ENCOUNTER — Other Ambulatory Visit: Payer: Self-pay

## 2022-11-04 ENCOUNTER — Encounter (HOSPITAL_BASED_OUTPATIENT_CLINIC_OR_DEPARTMENT_OTHER): Payer: Self-pay

## 2022-11-04 DIAGNOSIS — G8918 Other acute postprocedural pain: Secondary | ICD-10-CM | POA: Diagnosis not present

## 2022-11-04 DIAGNOSIS — M549 Dorsalgia, unspecified: Secondary | ICD-10-CM | POA: Diagnosis not present

## 2022-11-04 DIAGNOSIS — G8928 Other chronic postprocedural pain: Secondary | ICD-10-CM | POA: Insufficient documentation

## 2022-11-04 MED ORDER — OXYCODONE-ACETAMINOPHEN 5-325 MG PO TABS
1.0000 | ORAL_TABLET | Freq: Once | ORAL | Status: AC
  Administered 2022-11-04: 1 via ORAL
  Filled 2022-11-04: qty 1

## 2022-11-04 MED ORDER — OXYCODONE-ACETAMINOPHEN 5-325 MG PO TABS: 1.0000 | ORAL_TABLET | Freq: Four times a day (QID) | ORAL | 0 refills | Status: AC | PRN

## 2022-11-04 NOTE — ED Provider Notes (Signed)
[place] EMERGENCY DEPARTMENT AT [facility] Provider Note   CSN: [id number] Arrival date & time: 11/04/22  1949     History  Chief Complaint  Patient presents with   Back Pain    Megan Cook is a 57 y.o. female.  Patient presents to the ED with chief complaint of back pain x 3 days. She has a history of lipedema and had surgery to remove affected areas bilaterally on her mid-axillary line on April 11th in [place].  She has had 2 previous surgeries in the past year and a half as well.  She states that her pain has been controlled up until this past Saturday (2 days ago) when she ran out of hydrocodone/acetaminophen. She has been in close contact with her primary care who have been relentlessly contacting a local pain management clinic in hopes of getting her seen soon. She describes the pain as "internal burning" diffusely across her back with radiation to her mid-axillary line, currently rated 8/10. She takes OTC pain medications during the day, but typically requires opioid strength medication to sleep at night. The patient had previously been prescribed both pregabalin and gabapentin for her nerve pain, but these medications did not improve her symptoms.  Symptoms are typical for her postoperative pain.  No overlying redness or swelling, drainage from the wound, fevers.       Home Medications Prior to Admission medications   Medication Sig Start Date End Date Taking? Authorizing Provider  oxyCODONE-acetaminophen (PERCOCET/ROXICET) 5-325 MG tablet Take 1 tablet by mouth every 6 (six) hours as needed for severe pain. 11/04/22  Yes Renne Crigler, PA-C  ibuprofen (ADVIL) 800 MG tablet Take 1 tablet (800 mg total) by mouth every 8 (eight) hours as needed (pain). 08/21/21   Zenia Resides, MD  levothyroxine (SYNTHROID) 50 MCG tablet Take 1 tablet (50 mcg total) by mouth daily before breakfast. 10/14/17   Ethelda Chick, MD  predniSONE (STERAPRED UNI-PAK 21 TAB) 10 MG  (21) TBPK tablet Take by mouth daily. Please follow dosage instructions on package 09/18/22   Rising, Lurena Joiner, PA-C      Allergies    Methylisothiazolinone, Tramadol hcl, Carbocaine [mepivacaine hcl], Codeine, Gabapentin, Lyrica [pregabalin], and Trazodone and nefazodone    Review of Systems   Review of Systems  Physical Exam Updated Vital Signs BP (!) 151/100   Pulse 87   Temp 98.1 F (36.7 C) (Oral)   Resp 18   Ht 5\' 7"  (1.702 m)   Wt 91.6 kg   LMP 07/13/2011   SpO2 99%   BMI 31.64 kg/m  Physical Exam Vitals and nursing note reviewed.  Constitutional:      Appearance: She is well-developed.  HENT:     Head: Normocephalic and atraumatic.  Eyes:     Conjunctiva/sclera: Conjunctivae normal.  Pulmonary:     Effort: No respiratory distress.  Musculoskeletal:     Cervical back: Normal range of motion and neck supple.  Skin:    General: Skin is warm and dry.     Comments: Postoperative wound extending approximately 20 cm in the posterior axillary line on the right side.  Wound is clean dry and intact.  Appears to be healing normally.  No signs of cellulitis or drainage.  Mild tenderness to palpation.  Neurological:     Mental Status: She is alert.     ED Results / Procedures / Treatments   Labs (all labs ordered are listed, but only abnormal results are displayed) Labs Reviewed -  No data to display  EKG None  Radiology No results found.  Procedures Procedures    Medications Ordered in ED Medications - No data to display  ED Course/ Medical Decision Making/ A&P    Patient seen and examined. History obtained directly from patient.  I reviewed previous ED notes.  Patient also brought some notes from her primary care as well which I reviewed.  Labs/EKG: None ordered  Imaging: None ordered  Medications/Fluids: Ordered: P.o. Percocet  Most recent vital signs reviewed and are as follows: BP (!) 151/100   Pulse 87   Temp 98.1 F (36.7 C) (Oral)   Resp 18    Ht 5\' 7"  (1.702 m)   Wt 91.6 kg   LMP 07/13/2011   SpO2 99%   BMI 31.64 kg/m   Initial impression: Postoperative pain  Home treatment plan: Continue home management, I prescribed # 10 tablets of oxycodone-acetaminophen 5 mg / 325 mg.  Return instructions discussed with patient: Worsening symptoms  Follow-up instructions discussed with patient: Follow-up with PCP to continue working on pain management follow-up.                            Medical Decision Making Risk Prescription drug management.   Patient is here due to pain management of chronic postoperative pain.  No signs of infection tonight.  Pain is typical for the patient.  Her history given matches what is in the substance reporting database.  No emergent conditions suspected.  The patient's vital signs, pertinent lab work and imaging were reviewed and interpreted as discussed in the ED course. Hospitalization was considered for further testing, treatments, or serial exams/observation. However as patient is well-appearing, has a stable exam, and reassuring studies today, I do not feel that they warrant admission at this time. This plan was discussed with the patient who verbalizes agreement and comfort with this plan and seems reliable and able to return to the Emergency Department with worsening or changing symptoms.          Final Clinical Impression(s) / ED Diagnoses Final diagnoses:  Post-operative pain    Rx / DC Orders ED Discharge Orders          Ordered    oxyCODONE-acetaminophen (PERCOCET/ROXICET) 5-325 MG tablet  Every 6 hours PRN        11/04/22 2136              Renne Crigler, PA-C 11/04/22 2145    Glyn Ade, MD 11/05/22 1503

## 2022-11-04 NOTE — ED Triage Notes (Signed)
Patient here POV from Home.  Endorses Lower Back Pain since Operation in [place] in [place] April.   History of Lipedema. Referred to Pain Clinic but is still pending appointment/assessment. No Known Fevers. No N/V/D. No Dysuria. No Hematuria.    NAD Noted during triage. A&Ox4. Gcs 15. Ambulatory.

## 2022-11-04 NOTE — Discharge Instructions (Signed)
Please read and follow all provided instructions.  Your diagnoses today include:  1. Post-operative pain     Tests performed today include: Vital signs. See below for your results today.   Medications prescribed:  Percocet (oxycodone/acetaminophen) - narcotic pain medication  DO NOT drive or perform any activities that require you to be awake and alert because this medicine can make you drowsy. BE VERY CAREFUL not to take multiple medicines containing Tylenol (also called acetaminophen). Doing so can lead to an overdose which can damage your liver and cause liver failure and possibly death.  Take any prescribed medications only as directed.  Home care instructions:  Follow any educational materials contained in this packet.  BE VERY CAREFUL not to take multiple medicines containing Tylenol (also called acetaminophen). Doing so can lead to an overdose which can damage your liver and cause liver failure and possibly death.   Follow-up instructions: Please follow-up with your primary care provider in the next 3 days for further evaluation of your symptoms.   Return instructions:  Please return to the Emergency Department if you experience worsening symptoms.  Please return if you have any other emergent concerns.  Additional Information:  Your vital signs today were: BP (!) 151/100   Pulse 87   Temp 98.1 F (36.7 C) (Oral)   Resp 18   Ht 5\' 7"  (1.702 m)   Wt 91.6 kg   LMP 07/13/2011   SpO2 99%   BMI 31.64 kg/m  If your blood pressure (BP) was elevated above 135/85 this visit, please have this repeated by your doctor within one month. --------------

## 2022-11-13 ENCOUNTER — Other Ambulatory Visit: Payer: Self-pay | Admitting: Family Medicine

## 2022-11-13 DIAGNOSIS — E039 Hypothyroidism, unspecified: Secondary | ICD-10-CM | POA: Diagnosis not present

## 2022-11-13 DIAGNOSIS — R748 Abnormal levels of other serum enzymes: Secondary | ICD-10-CM | POA: Diagnosis not present

## 2022-11-13 DIAGNOSIS — T8130XA Disruption of wound, unspecified, initial encounter: Secondary | ICD-10-CM | POA: Diagnosis not present

## 2022-11-13 DIAGNOSIS — G894 Chronic pain syndrome: Secondary | ICD-10-CM | POA: Diagnosis not present

## 2022-11-19 DIAGNOSIS — S143XXA Injury of brachial plexus, initial encounter: Secondary | ICD-10-CM | POA: Diagnosis not present

## 2022-11-19 DIAGNOSIS — G894 Chronic pain syndrome: Secondary | ICD-10-CM | POA: Diagnosis not present

## 2022-11-19 DIAGNOSIS — Z79891 Long term (current) use of opiate analgesic: Secondary | ICD-10-CM | POA: Diagnosis not present

## 2022-11-19 DIAGNOSIS — M47816 Spondylosis without myelopathy or radiculopathy, lumbar region: Secondary | ICD-10-CM | POA: Diagnosis not present

## 2022-12-02 ENCOUNTER — Ambulatory Visit
Admission: RE | Admit: 2022-12-02 | Discharge: 2022-12-02 | Disposition: A | Discharge status: Home / Self Care | Payer: BC Managed Care – PPO | Source: Ambulatory Visit | Attending: Family Medicine | Admitting: Family Medicine

## 2022-12-02 DIAGNOSIS — R748 Abnormal levels of other serum enzymes: Secondary | ICD-10-CM | POA: Diagnosis not present

## 2022-12-18 DIAGNOSIS — G894 Chronic pain syndrome: Secondary | ICD-10-CM | POA: Diagnosis not present

## 2022-12-18 DIAGNOSIS — M47816 Spondylosis without myelopathy or radiculopathy, lumbar region: Secondary | ICD-10-CM | POA: Diagnosis not present

## 2022-12-18 DIAGNOSIS — S143XXA Injury of brachial plexus, initial encounter: Secondary | ICD-10-CM | POA: Diagnosis not present

## 2022-12-18 DIAGNOSIS — Z79891 Long term (current) use of opiate analgesic: Secondary | ICD-10-CM | POA: Diagnosis not present

## 2023-01-22 DIAGNOSIS — S143XXA Injury of brachial plexus, initial encounter: Secondary | ICD-10-CM | POA: Diagnosis not present

## 2023-01-22 DIAGNOSIS — Z79891 Long term (current) use of opiate analgesic: Secondary | ICD-10-CM | POA: Diagnosis not present

## 2023-01-22 DIAGNOSIS — G894 Chronic pain syndrome: Secondary | ICD-10-CM | POA: Diagnosis not present

## 2023-01-22 DIAGNOSIS — M47816 Spondylosis without myelopathy or radiculopathy, lumbar region: Secondary | ICD-10-CM | POA: Diagnosis not present

## 2023-02-18 DIAGNOSIS — Z79899 Other long term (current) drug therapy: Secondary | ICD-10-CM | POA: Diagnosis not present

## 2023-02-18 DIAGNOSIS — E668 Other obesity: Secondary | ICD-10-CM | POA: Diagnosis not present

## 2023-02-18 DIAGNOSIS — Z23 Encounter for immunization: Secondary | ICD-10-CM | POA: Diagnosis not present

## 2023-02-19 DIAGNOSIS — G894 Chronic pain syndrome: Secondary | ICD-10-CM | POA: Diagnosis not present

## 2023-02-19 DIAGNOSIS — M47816 Spondylosis without myelopathy or radiculopathy, lumbar region: Secondary | ICD-10-CM | POA: Diagnosis not present

## 2023-02-19 DIAGNOSIS — S143XXA Injury of brachial plexus, initial encounter: Secondary | ICD-10-CM | POA: Diagnosis not present

## 2023-02-19 DIAGNOSIS — Z79891 Long term (current) use of opiate analgesic: Secondary | ICD-10-CM | POA: Diagnosis not present

## 2023-03-07 ENCOUNTER — Ambulatory Visit: Payer: BC Managed Care – PPO

## 2023-03-07 DIAGNOSIS — Z23 Encounter for immunization: Secondary | ICD-10-CM

## 2023-04-09 DIAGNOSIS — S143XXA Injury of brachial plexus, initial encounter: Secondary | ICD-10-CM | POA: Diagnosis not present

## 2023-04-09 DIAGNOSIS — G894 Chronic pain syndrome: Secondary | ICD-10-CM | POA: Diagnosis not present

## 2023-04-09 DIAGNOSIS — M47816 Spondylosis without myelopathy or radiculopathy, lumbar region: Secondary | ICD-10-CM | POA: Diagnosis not present

## 2023-04-09 DIAGNOSIS — Z79891 Long term (current) use of opiate analgesic: Secondary | ICD-10-CM | POA: Diagnosis not present

## 2023-04-10 DIAGNOSIS — R7303 Prediabetes: Secondary | ICD-10-CM | POA: Diagnosis not present

## 2023-04-10 DIAGNOSIS — Z9189 Other specified personal risk factors, not elsewhere classified: Secondary | ICD-10-CM | POA: Diagnosis not present

## 2023-04-10 DIAGNOSIS — E782 Mixed hyperlipidemia: Secondary | ICD-10-CM | POA: Diagnosis not present

## 2023-04-10 DIAGNOSIS — E039 Hypothyroidism, unspecified: Secondary | ICD-10-CM | POA: Diagnosis not present

## 2023-04-10 DIAGNOSIS — R7309 Other abnormal glucose: Secondary | ICD-10-CM | POA: Diagnosis not present

## 2023-04-10 DIAGNOSIS — Z1331 Encounter for screening for depression: Secondary | ICD-10-CM | POA: Diagnosis not present

## 2023-04-10 DIAGNOSIS — R609 Edema, unspecified: Secondary | ICD-10-CM | POA: Diagnosis not present

## 2023-04-10 DIAGNOSIS — E8889 Other specified metabolic disorders: Secondary | ICD-10-CM | POA: Diagnosis not present

## 2023-04-23 DIAGNOSIS — H5000 Unspecified esotropia: Secondary | ICD-10-CM | POA: Diagnosis not present

## 2023-04-23 DIAGNOSIS — H40013 Open angle with borderline findings, low risk, bilateral: Secondary | ICD-10-CM | POA: Diagnosis not present

## 2023-05-13 DIAGNOSIS — R609 Edema, unspecified: Secondary | ICD-10-CM | POA: Diagnosis not present

## 2023-05-13 DIAGNOSIS — E039 Hypothyroidism, unspecified: Secondary | ICD-10-CM | POA: Diagnosis not present

## 2023-05-13 DIAGNOSIS — R7303 Prediabetes: Secondary | ICD-10-CM | POA: Diagnosis not present

## 2023-05-13 DIAGNOSIS — E782 Mixed hyperlipidemia: Secondary | ICD-10-CM | POA: Diagnosis not present

## 2023-05-27 DIAGNOSIS — R7303 Prediabetes: Secondary | ICD-10-CM | POA: Diagnosis not present

## 2023-05-27 DIAGNOSIS — R609 Edema, unspecified: Secondary | ICD-10-CM | POA: Diagnosis not present

## 2023-05-27 DIAGNOSIS — E039 Hypothyroidism, unspecified: Secondary | ICD-10-CM | POA: Diagnosis not present

## 2023-05-27 DIAGNOSIS — E782 Mixed hyperlipidemia: Secondary | ICD-10-CM | POA: Diagnosis not present

## 2023-05-27 DIAGNOSIS — R051 Acute cough: Secondary | ICD-10-CM | POA: Diagnosis not present

## 2023-06-02 DIAGNOSIS — Z981 Arthrodesis status: Secondary | ICD-10-CM | POA: Diagnosis not present

## 2023-06-02 DIAGNOSIS — R0602 Shortness of breath: Secondary | ICD-10-CM | POA: Diagnosis not present

## 2023-06-24 DIAGNOSIS — E782 Mixed hyperlipidemia: Secondary | ICD-10-CM | POA: Diagnosis not present

## 2023-06-24 DIAGNOSIS — E039 Hypothyroidism, unspecified: Secondary | ICD-10-CM | POA: Diagnosis not present

## 2023-06-24 DIAGNOSIS — R7303 Prediabetes: Secondary | ICD-10-CM | POA: Diagnosis not present

## 2023-06-24 DIAGNOSIS — R609 Edema, unspecified: Secondary | ICD-10-CM | POA: Diagnosis not present

## 2023-07-23 DIAGNOSIS — R609 Edema, unspecified: Secondary | ICD-10-CM | POA: Diagnosis not present

## 2023-07-23 DIAGNOSIS — E039 Hypothyroidism, unspecified: Secondary | ICD-10-CM | POA: Diagnosis not present

## 2023-07-23 DIAGNOSIS — R7303 Prediabetes: Secondary | ICD-10-CM | POA: Diagnosis not present

## 2023-07-23 DIAGNOSIS — E782 Mixed hyperlipidemia: Secondary | ICD-10-CM | POA: Diagnosis not present

## 2023-08-08 DIAGNOSIS — J302 Other seasonal allergic rhinitis: Secondary | ICD-10-CM | POA: Diagnosis not present

## 2023-08-08 DIAGNOSIS — J019 Acute sinusitis, unspecified: Secondary | ICD-10-CM | POA: Diagnosis not present

## 2023-08-12 DIAGNOSIS — D485 Neoplasm of uncertain behavior of skin: Secondary | ICD-10-CM | POA: Diagnosis not present

## 2023-08-12 DIAGNOSIS — L570 Actinic keratosis: Secondary | ICD-10-CM | POA: Diagnosis not present

## 2023-08-12 DIAGNOSIS — L814 Other melanin hyperpigmentation: Secondary | ICD-10-CM | POA: Diagnosis not present

## 2023-08-12 DIAGNOSIS — L818 Other specified disorders of pigmentation: Secondary | ICD-10-CM | POA: Diagnosis not present

## 2023-08-12 DIAGNOSIS — L821 Other seborrheic keratosis: Secondary | ICD-10-CM | POA: Diagnosis not present

## 2023-08-12 DIAGNOSIS — D2271 Melanocytic nevi of right lower limb, including hip: Secondary | ICD-10-CM | POA: Diagnosis not present

## 2023-08-13 DIAGNOSIS — Z79891 Long term (current) use of opiate analgesic: Secondary | ICD-10-CM | POA: Diagnosis not present

## 2023-08-13 DIAGNOSIS — S143XXA Injury of brachial plexus, initial encounter: Secondary | ICD-10-CM | POA: Diagnosis not present

## 2023-08-13 DIAGNOSIS — M47816 Spondylosis without myelopathy or radiculopathy, lumbar region: Secondary | ICD-10-CM | POA: Diagnosis not present

## 2023-08-13 DIAGNOSIS — G894 Chronic pain syndrome: Secondary | ICD-10-CM | POA: Diagnosis not present

## 2023-08-20 DIAGNOSIS — R609 Edema, unspecified: Secondary | ICD-10-CM | POA: Diagnosis not present

## 2023-08-20 DIAGNOSIS — E039 Hypothyroidism, unspecified: Secondary | ICD-10-CM | POA: Diagnosis not present

## 2023-08-20 DIAGNOSIS — R7303 Prediabetes: Secondary | ICD-10-CM | POA: Diagnosis not present

## 2023-08-20 DIAGNOSIS — Z9189 Other specified personal risk factors, not elsewhere classified: Secondary | ICD-10-CM | POA: Diagnosis not present

## 2023-08-20 DIAGNOSIS — E782 Mixed hyperlipidemia: Secondary | ICD-10-CM | POA: Diagnosis not present

## 2023-09-24 DIAGNOSIS — E039 Hypothyroidism, unspecified: Secondary | ICD-10-CM | POA: Diagnosis not present

## 2023-09-24 DIAGNOSIS — R609 Edema, unspecified: Secondary | ICD-10-CM | POA: Diagnosis not present

## 2023-09-24 DIAGNOSIS — E782 Mixed hyperlipidemia: Secondary | ICD-10-CM | POA: Diagnosis not present

## 2023-09-24 DIAGNOSIS — R7303 Prediabetes: Secondary | ICD-10-CM | POA: Diagnosis not present

## 2023-10-22 DIAGNOSIS — H40013 Open angle with borderline findings, low risk, bilateral: Secondary | ICD-10-CM | POA: Diagnosis not present

## 2023-10-22 DIAGNOSIS — H53001 Unspecified amblyopia, right eye: Secondary | ICD-10-CM | POA: Diagnosis not present

## 2023-10-22 DIAGNOSIS — E782 Mixed hyperlipidemia: Secondary | ICD-10-CM | POA: Diagnosis not present

## 2023-10-22 DIAGNOSIS — H5203 Hypermetropia, bilateral: Secondary | ICD-10-CM | POA: Diagnosis not present

## 2023-10-22 DIAGNOSIS — E039 Hypothyroidism, unspecified: Secondary | ICD-10-CM | POA: Diagnosis not present

## 2023-10-22 DIAGNOSIS — Z9189 Other specified personal risk factors, not elsewhere classified: Secondary | ICD-10-CM | POA: Diagnosis not present

## 2023-10-22 DIAGNOSIS — H5000 Unspecified esotropia: Secondary | ICD-10-CM | POA: Diagnosis not present

## 2023-10-22 DIAGNOSIS — R7303 Prediabetes: Secondary | ICD-10-CM | POA: Diagnosis not present

## 2023-10-22 DIAGNOSIS — R609 Edema, unspecified: Secondary | ICD-10-CM | POA: Diagnosis not present

## 2023-10-29 DIAGNOSIS — Z1231 Encounter for screening mammogram for malignant neoplasm of breast: Secondary | ICD-10-CM | POA: Diagnosis not present

## 2023-10-29 DIAGNOSIS — Z1151 Encounter for screening for human papillomavirus (HPV): Secondary | ICD-10-CM | POA: Diagnosis not present

## 2023-10-29 DIAGNOSIS — Z6833 Body mass index (BMI) 33.0-33.9, adult: Secondary | ICD-10-CM | POA: Diagnosis not present

## 2023-10-29 DIAGNOSIS — Z124 Encounter for screening for malignant neoplasm of cervix: Secondary | ICD-10-CM | POA: Diagnosis not present

## 2023-10-29 DIAGNOSIS — Z01419 Encounter for gynecological examination (general) (routine) without abnormal findings: Secondary | ICD-10-CM | POA: Diagnosis not present

## 2023-11-19 DIAGNOSIS — R7303 Prediabetes: Secondary | ICD-10-CM | POA: Diagnosis not present

## 2023-11-19 DIAGNOSIS — R609 Edema, unspecified: Secondary | ICD-10-CM | POA: Diagnosis not present

## 2023-11-19 DIAGNOSIS — E782 Mixed hyperlipidemia: Secondary | ICD-10-CM | POA: Diagnosis not present

## 2023-11-19 DIAGNOSIS — Z79899 Other long term (current) drug therapy: Secondary | ICD-10-CM | POA: Diagnosis not present

## 2023-11-19 DIAGNOSIS — Z6831 Body mass index (BMI) 31.0-31.9, adult: Secondary | ICD-10-CM | POA: Diagnosis not present

## 2023-11-19 DIAGNOSIS — E039 Hypothyroidism, unspecified: Secondary | ICD-10-CM | POA: Diagnosis not present

## 2023-12-10 DIAGNOSIS — R293 Abnormal posture: Secondary | ICD-10-CM | POA: Diagnosis not present

## 2023-12-10 DIAGNOSIS — M6281 Muscle weakness (generalized): Secondary | ICD-10-CM | POA: Diagnosis not present

## 2023-12-10 DIAGNOSIS — M25512 Pain in left shoulder: Secondary | ICD-10-CM | POA: Diagnosis not present

## 2023-12-10 DIAGNOSIS — M25612 Stiffness of left shoulder, not elsewhere classified: Secondary | ICD-10-CM | POA: Diagnosis not present

## 2023-12-15 DIAGNOSIS — M25612 Stiffness of left shoulder, not elsewhere classified: Secondary | ICD-10-CM | POA: Diagnosis not present

## 2023-12-15 DIAGNOSIS — M6281 Muscle weakness (generalized): Secondary | ICD-10-CM | POA: Diagnosis not present

## 2023-12-15 DIAGNOSIS — M25512 Pain in left shoulder: Secondary | ICD-10-CM | POA: Diagnosis not present

## 2023-12-15 DIAGNOSIS — R293 Abnormal posture: Secondary | ICD-10-CM | POA: Diagnosis not present

## 2023-12-17 DIAGNOSIS — M25512 Pain in left shoulder: Secondary | ICD-10-CM | POA: Diagnosis not present

## 2023-12-17 DIAGNOSIS — M25612 Stiffness of left shoulder, not elsewhere classified: Secondary | ICD-10-CM | POA: Diagnosis not present

## 2023-12-17 DIAGNOSIS — R293 Abnormal posture: Secondary | ICD-10-CM | POA: Diagnosis not present

## 2023-12-17 DIAGNOSIS — M6281 Muscle weakness (generalized): Secondary | ICD-10-CM | POA: Diagnosis not present

## 2023-12-22 DIAGNOSIS — M25612 Stiffness of left shoulder, not elsewhere classified: Secondary | ICD-10-CM | POA: Diagnosis not present

## 2023-12-22 DIAGNOSIS — M25512 Pain in left shoulder: Secondary | ICD-10-CM | POA: Diagnosis not present

## 2023-12-22 DIAGNOSIS — M6281 Muscle weakness (generalized): Secondary | ICD-10-CM | POA: Diagnosis not present

## 2023-12-22 DIAGNOSIS — R293 Abnormal posture: Secondary | ICD-10-CM | POA: Diagnosis not present

## 2023-12-24 DIAGNOSIS — R7303 Prediabetes: Secondary | ICD-10-CM | POA: Diagnosis not present

## 2023-12-24 DIAGNOSIS — E039 Hypothyroidism, unspecified: Secondary | ICD-10-CM | POA: Diagnosis not present

## 2023-12-24 DIAGNOSIS — R609 Edema, unspecified: Secondary | ICD-10-CM | POA: Diagnosis not present

## 2023-12-24 DIAGNOSIS — E782 Mixed hyperlipidemia: Secondary | ICD-10-CM | POA: Diagnosis not present

## 2023-12-25 DIAGNOSIS — M19012 Primary osteoarthritis, left shoulder: Secondary | ICD-10-CM | POA: Diagnosis not present

## 2023-12-31 DIAGNOSIS — M6281 Muscle weakness (generalized): Secondary | ICD-10-CM | POA: Diagnosis not present

## 2023-12-31 DIAGNOSIS — M25612 Stiffness of left shoulder, not elsewhere classified: Secondary | ICD-10-CM | POA: Diagnosis not present

## 2023-12-31 DIAGNOSIS — M25512 Pain in left shoulder: Secondary | ICD-10-CM | POA: Diagnosis not present

## 2023-12-31 DIAGNOSIS — R293 Abnormal posture: Secondary | ICD-10-CM | POA: Diagnosis not present

## 2024-01-21 DIAGNOSIS — E039 Hypothyroidism, unspecified: Secondary | ICD-10-CM | POA: Diagnosis not present

## 2024-01-21 DIAGNOSIS — E782 Mixed hyperlipidemia: Secondary | ICD-10-CM | POA: Diagnosis not present

## 2024-01-21 DIAGNOSIS — R7303 Prediabetes: Secondary | ICD-10-CM | POA: Diagnosis not present

## 2024-01-21 DIAGNOSIS — R609 Edema, unspecified: Secondary | ICD-10-CM | POA: Diagnosis not present

## 2024-02-04 DIAGNOSIS — Z8639 Personal history of other endocrine, nutritional and metabolic disease: Secondary | ICD-10-CM | POA: Diagnosis not present

## 2024-02-04 DIAGNOSIS — Z6829 Body mass index (BMI) 29.0-29.9, adult: Secondary | ICD-10-CM | POA: Diagnosis not present

## 2024-02-04 DIAGNOSIS — Z7189 Other specified counseling: Secondary | ICD-10-CM | POA: Diagnosis not present

## 2024-02-04 DIAGNOSIS — E663 Overweight: Secondary | ICD-10-CM | POA: Diagnosis not present

## 2024-02-05 DIAGNOSIS — S143XXA Injury of brachial plexus, initial encounter: Secondary | ICD-10-CM | POA: Diagnosis not present

## 2024-02-05 DIAGNOSIS — M47816 Spondylosis without myelopathy or radiculopathy, lumbar region: Secondary | ICD-10-CM | POA: Diagnosis not present

## 2024-02-05 DIAGNOSIS — G894 Chronic pain syndrome: Secondary | ICD-10-CM | POA: Diagnosis not present

## 2024-02-05 DIAGNOSIS — Z79891 Long term (current) use of opiate analgesic: Secondary | ICD-10-CM | POA: Diagnosis not present

## 2024-02-12 DIAGNOSIS — J019 Acute sinusitis, unspecified: Secondary | ICD-10-CM | POA: Diagnosis not present

## 2024-03-03 DIAGNOSIS — R609 Edema, unspecified: Secondary | ICD-10-CM | POA: Diagnosis not present

## 2024-03-03 DIAGNOSIS — E782 Mixed hyperlipidemia: Secondary | ICD-10-CM | POA: Diagnosis not present

## 2024-03-03 DIAGNOSIS — R7303 Prediabetes: Secondary | ICD-10-CM | POA: Diagnosis not present

## 2024-03-03 DIAGNOSIS — E039 Hypothyroidism, unspecified: Secondary | ICD-10-CM | POA: Diagnosis not present

## 2024-04-02 DIAGNOSIS — E782 Mixed hyperlipidemia: Secondary | ICD-10-CM | POA: Diagnosis not present

## 2024-04-02 DIAGNOSIS — R7303 Prediabetes: Secondary | ICD-10-CM | POA: Diagnosis not present

## 2024-04-02 DIAGNOSIS — K760 Fatty (change of) liver, not elsewhere classified: Secondary | ICD-10-CM | POA: Diagnosis not present

## 2024-04-02 DIAGNOSIS — E039 Hypothyroidism, unspecified: Secondary | ICD-10-CM | POA: Diagnosis not present

## 2024-04-14 DIAGNOSIS — E782 Mixed hyperlipidemia: Secondary | ICD-10-CM | POA: Diagnosis not present

## 2024-04-14 DIAGNOSIS — E039 Hypothyroidism, unspecified: Secondary | ICD-10-CM | POA: Diagnosis not present

## 2024-04-14 DIAGNOSIS — R609 Edema, unspecified: Secondary | ICD-10-CM | POA: Diagnosis not present

## 2024-04-14 DIAGNOSIS — R7303 Prediabetes: Secondary | ICD-10-CM | POA: Diagnosis not present

## 2024-04-28 DIAGNOSIS — E039 Hypothyroidism, unspecified: Secondary | ICD-10-CM | POA: Diagnosis not present

## 2024-04-28 DIAGNOSIS — R944 Abnormal results of kidney function studies: Secondary | ICD-10-CM | POA: Diagnosis not present

## 2024-05-03 DIAGNOSIS — Z79891 Long term (current) use of opiate analgesic: Secondary | ICD-10-CM | POA: Diagnosis not present

## 2024-05-03 DIAGNOSIS — M47816 Spondylosis without myelopathy or radiculopathy, lumbar region: Secondary | ICD-10-CM | POA: Diagnosis not present

## 2024-05-03 DIAGNOSIS — S143XXA Injury of brachial plexus, initial encounter: Secondary | ICD-10-CM | POA: Diagnosis not present

## 2024-05-03 DIAGNOSIS — G894 Chronic pain syndrome: Secondary | ICD-10-CM | POA: Diagnosis not present

## 2024-06-23 ENCOUNTER — Other Ambulatory Visit: Payer: Self-pay | Admitting: Family Medicine

## 2024-06-23 DIAGNOSIS — H532 Diplopia: Secondary | ICD-10-CM

## 2024-06-23 DIAGNOSIS — R42 Dizziness and giddiness: Secondary | ICD-10-CM

## 2024-06-24 ENCOUNTER — Ambulatory Visit
Admission: RE | Admit: 2024-06-24 | Discharge: 2024-06-24 | Disposition: A | Discharge status: Home / Self Care | Source: Ambulatory Visit | Attending: Family Medicine | Admitting: Family Medicine

## 2024-06-24 DIAGNOSIS — R42 Dizziness and giddiness: Secondary | ICD-10-CM

## 2024-06-24 DIAGNOSIS — H532 Diplopia: Secondary | ICD-10-CM

## 2024-06-24 MED ORDER — GADOPICLENOL 0.5 MMOL/ML IV SOLN
7.5000 mL | Freq: Once | INTRAVENOUS | Status: AC | PRN
  Administered 2024-06-24: 7.5 mL via INTRAVENOUS
